# Patient Record
Sex: Female | Born: 1960 | Race: Black or African American | Hispanic: No | Marital: Single | Smoking: Former smoker
Health system: Southern US, Community
[De-identification: ages and names within clinical notes are randomized; demographics above are authoritative.]

## PROBLEM LIST (undated history)

## (undated) DIAGNOSIS — I509 Heart failure, unspecified: Secondary | ICD-10-CM

## (undated) DIAGNOSIS — J45909 Unspecified asthma, uncomplicated: Secondary | ICD-10-CM

## (undated) DIAGNOSIS — I10 Essential (primary) hypertension: Secondary | ICD-10-CM

## (undated) DIAGNOSIS — N939 Abnormal uterine and vaginal bleeding, unspecified: Secondary | ICD-10-CM

## (undated) DIAGNOSIS — G4733 Obstructive sleep apnea (adult) (pediatric): Secondary | ICD-10-CM

## (undated) DIAGNOSIS — J449 Chronic obstructive pulmonary disease, unspecified: Secondary | ICD-10-CM

## (undated) DIAGNOSIS — I272 Pulmonary hypertension, unspecified: Secondary | ICD-10-CM

## (undated) HISTORY — DX: Pulmonary hypertension, unspecified: I27.20

## (undated) HISTORY — PX: APPENDECTOMY: SHX54

## (undated) HISTORY — DX: Obstructive sleep apnea (adult) (pediatric): G47.33

## (undated) HISTORY — DX: Heart failure, unspecified: I50.9

---

## 2004-12-03 ENCOUNTER — Emergency Department: Payer: Self-pay | Admitting: Emergency Medicine

## 2005-02-02 ENCOUNTER — Ambulatory Visit: Payer: Self-pay | Admitting: Family Medicine

## 2005-03-21 ENCOUNTER — Ambulatory Visit: Payer: Self-pay

## 2005-06-15 ENCOUNTER — Ambulatory Visit: Payer: Self-pay

## 2006-07-12 ENCOUNTER — Ambulatory Visit: Payer: Self-pay

## 2007-09-18 ENCOUNTER — Emergency Department: Payer: Self-pay | Admitting: Emergency Medicine

## 2008-01-01 ENCOUNTER — Ambulatory Visit: Payer: Self-pay

## 2008-08-28 ENCOUNTER — Emergency Department: Payer: Self-pay | Admitting: Emergency Medicine

## 2008-08-30 ENCOUNTER — Inpatient Hospital Stay: Payer: Self-pay | Admitting: Internal Medicine

## 2008-09-15 ENCOUNTER — Ambulatory Visit: Payer: Self-pay | Admitting: Family Medicine

## 2009-06-23 ENCOUNTER — Ambulatory Visit: Payer: Self-pay | Admitting: Family Medicine

## 2010-09-15 ENCOUNTER — Emergency Department: Payer: Self-pay | Admitting: Emergency Medicine

## 2012-03-05 ENCOUNTER — Emergency Department: Payer: Self-pay | Admitting: Emergency Medicine

## 2012-03-05 LAB — URINALYSIS, COMPLETE
Ketone: NEGATIVE
Nitrite: NEGATIVE
Ph: 6 (ref 4.5–8.0)
Protein: 30
RBC,UR: 17 /HPF (ref 0–5)
Specific Gravity: 1.016 (ref 1.003–1.030)
Transitional Epi: 1
WBC UR: 304 /HPF (ref 0–5)

## 2012-03-08 ENCOUNTER — Emergency Department: Payer: Self-pay | Admitting: Emergency Medicine

## 2012-03-08 LAB — URINALYSIS, COMPLETE: WBC UR: 3808 /HPF (ref 0–5)

## 2012-03-10 LAB — URINE CULTURE

## 2014-04-06 ENCOUNTER — Emergency Department: Payer: Self-pay | Admitting: Emergency Medicine

## 2014-04-06 LAB — CBC
HCT: 43.2 % (ref 35.0–47.0)
HCT: 41 % (ref 35.0–47.0)
HGB: 13.7 g/dL (ref 12.0–16.0)
HGB: 13.3 g/dL (ref 12.0–16.0)
MCH: 29.4 pg (ref 26.0–34.0)
MCH: 30 pg (ref 26.0–34.0)
MCHC: 31.7 g/dL — AB (ref 32.0–36.0)
MCHC: 32.4 g/dL (ref 32.0–36.0)
MCV: 93 fL (ref 80–100)
MCV: 93 fL (ref 80–100)
PLATELETS: 279 10*3/uL (ref 150–440)
Platelet: 279 10*3/uL (ref 150–440)
RBC: 4.66 10*6/uL (ref 3.80–5.20)
RBC: 4.43 10*6/uL (ref 3.80–5.20)
RDW: 17.4 % — ABNORMAL HIGH (ref 11.5–14.5)
RDW: 17.4 % — AB (ref 11.5–14.5)
WBC: 6.3 10*3/uL (ref 3.6–11.0)
WBC: 6.9 10*3/uL (ref 3.6–11.0)

## 2014-04-06 LAB — URINALYSIS, COMPLETE
SPECIFIC GRAVITY: 1.016 (ref 1.003–1.030)
Squamous Epithelial: NONE SEEN

## 2014-10-05 ENCOUNTER — Emergency Department: Payer: Self-pay | Admitting: Emergency Medicine

## 2015-03-23 ENCOUNTER — Emergency Department
Admit: 2015-03-23 | Disposition: A | Discharge status: Home / Self Care | Payer: Self-pay | Admitting: Emergency Medicine

## 2015-03-23 LAB — BASIC METABOLIC PANEL
Anion Gap: 7 (ref 7–16)
BUN: 14 mg/dL
CALCIUM: 8.9 mg/dL
CO2: 29 mmol/L
CREATININE: 0.94 mg/dL
Chloride: 103 mmol/L
EGFR (African American): >60
EGFR (Non-African Amer.): >60
Glucose: 105 mg/dL — ABNORMAL HIGH
Potassium: 4 mmol/L
SODIUM: 139 mmol/L

## 2015-03-23 LAB — CBC
HCT: 40.9 % (ref 35.0–47.0)
HGB: 12.8 g/dL (ref 12.0–16.0)
MCH: 28.3 pg (ref 26.0–34.0)
MCHC: 31.3 g/dL — ABNORMAL LOW (ref 32.0–36.0)
MCV: 91 fL (ref 80–100)
PLATELETS: 280 10*3/uL (ref 150–440)
RBC: 4.51 10*6/uL (ref 3.80–5.20)
RDW: 17.9 % — ABNORMAL HIGH (ref 11.5–14.5)
WBC: 8.7 10*3/uL (ref 3.6–11.0)

## 2015-03-23 LAB — TROPONIN I: Troponin-I: <0.03 ng/mL

## 2016-03-24 ENCOUNTER — Encounter: Payer: Self-pay | Admitting: Emergency Medicine

## 2016-03-24 ENCOUNTER — Ambulatory Visit
Admission: EM | Admit: 2016-03-24 | Discharge: 2016-03-24 | Disposition: A | Discharge status: Home / Self Care | Payer: Self-pay | Attending: Family Medicine | Admitting: Family Medicine

## 2016-03-24 DIAGNOSIS — N924 Excessive bleeding in the premenopausal period: Secondary | ICD-10-CM

## 2016-03-24 DIAGNOSIS — N938 Other specified abnormal uterine and vaginal bleeding: Secondary | ICD-10-CM

## 2016-03-24 HISTORY — DX: Essential (primary) hypertension: I10

## 2016-03-24 LAB — CBC WITH DIFFERENTIAL/PLATELET
BASOS PCT: 1 %
Basophils Absolute: 0.1 10*3/uL (ref 0–0.1)
EOS PCT: 1 %
Eosinophils Absolute: 0.1 10*3/uL (ref 0–0.7)
HEMATOCRIT: 39.7 % (ref 35.0–47.0)
Hemoglobin: 12.9 g/dL (ref 12.0–16.0)
LYMPHS ABS: 3 10*3/uL (ref 1.0–3.6)
Lymphocytes Relative: 28 %
MCH: 28.4 pg (ref 26.0–34.0)
MCHC: 32.5 g/dL (ref 32.0–36.0)
MCV: 87.4 fL (ref 80.0–100.0)
MONO ABS: 0.7 10*3/uL (ref 0.2–0.9)
Monocytes Relative: 6 %
Neutro Abs: 6.8 10*3/uL — ABNORMAL HIGH (ref 1.4–6.5)
Neutrophils Relative %: 64 %
Platelets: 298 10*3/uL (ref 150–440)
RBC: 4.55 MIL/uL (ref 3.80–5.20)
RDW: 18.9 % — ABNORMAL HIGH (ref 11.5–14.5)
WBC: 10.7 10*3/uL (ref 3.6–11.0)

## 2016-03-24 LAB — BASIC METABOLIC PANEL
ANION GAP: 5 (ref 5–15)
BUN: 11 mg/dL (ref 6–20)
CO2: 25 mmol/L (ref 22–32)
Calcium: 8.8 mg/dL — ABNORMAL LOW (ref 8.9–10.3)
Chloride: 105 mmol/L (ref 101–111)
Creatinine, Ser: 0.63 mg/dL (ref 0.44–1.00)
GFR calc Af Amer: >60 mL/min (ref >60)
Glucose, Bld: 98 mg/dL (ref 65–99)
POTASSIUM: 4.1 mmol/L (ref 3.5–5.1)
Sodium: 135 mmol/L (ref 135–145)

## 2016-03-24 MED ORDER — KETOROLAC TROMETHAMINE 60 MG/2ML IM SOLN
60.0000 mg | Freq: Once | INTRAMUSCULAR | Status: AC
  Administered 2016-03-24: 60 mg via INTRAMUSCULAR

## 2016-03-24 MED ORDER — NAPROXEN SODIUM 550 MG PO TABS: 550.0000 mg | ORAL_TABLET | Freq: Two times a day (BID) | ORAL | Status: DC

## 2016-03-24 NOTE — ED Notes (Signed)
Pt reports vaginal bleeding that started worsening last night. Pt reports has ongoing vaginal bleeding over last 3 years was seen at [facility] then and given depo that helped but then bleeding returned and has been intermittent in severity but this morning awoke with worsened bleeding. Pt reports vaginal pain as well denies abd pain.

## 2016-03-24 NOTE — ED Provider Notes (Signed)
CSN: [id number]     Arrival date & time 03/24/16  1007 History   First MD Initiated Contact with Patient 03/24/16 1128    Nurses notes were reviewed.  Chief Complaint  Patient presents with  . Vaginal Bleeding   Patient is here because of vaginal bleeding she states that she's been bleeding off and on since December. Using a 4 day or 2 but today she had bleeding that was extreme and woke her up. She states that she woke in a puddle blood in the bed. Her friend and roommate states that she's had times where she is literally dripping blood at times and if the toilet will have clots of blood present. Interesting she had menopausal symptoms change life which was 50. When she was 68 she started bleeding again and was evaluated by the OB/gyn at [place]. She states that she was treated and no signs of uterine cancer was found. At that time she was given an injection of double Provera which actually stopped the bleeding for about 3 years. She states that in December when she started having bleeding she contacted [facility] about a GYN appointment and this was called back and they never did. The bleeding started back up in December but has never been this bad as it was today. She is also having abdominal pain and cramping since her bleeding started again   She reports feeling lightheaded and dizzy now since the bleeding has started. She denies any chance of pregnancy since her last time she was sexually active was about 4 years ago. She does have history of hypertension she does smoke and she's had 2 C-sections. She reports her mother wanted having a hysterectomy in her 35s as well because of postmenopausal bleeding.   (Consider location/radiation/quality/duration/timing/severity/associated sxs/prior Treatment) Patient is a 55 y.o. female presenting with vaginal bleeding. The history is provided by the patient. No language interpreter was used.  Vaginal Bleeding Quality:  Bright red and clots Severity:   Moderate Timing:  Constant Progression:  Worsening Chronicity:  New Menstrual history:  Irregular Context: at rest   Relieved by:  Nothing Associated symptoms: abdominal pain   Associated symptoms: no back pain   Risk factors: gynecological surgery   Risk factors: no hx of ectopic pregnancy, no hx of endometriosis, no new sexual partner, no ovarian cysts, no ovarian torsion, no terminated pregnancies and does not have unprotected sex     Past Medical History  Diagnosis Date  . Hypertension    No past surgical history on file. No family history on file. Social History  Substance Use Topics  . Smoking status: Current Every Day Smoker -- 1.00 packs/day    Types: Cigarettes  . Smokeless tobacco: None  . Alcohol Use: No   OB History    No data available     Review of Systems  Constitutional: Negative for activity change and appetite change.  Gastrointestinal: Positive for abdominal pain.  Genitourinary: Positive for vaginal bleeding and menstrual problem.  Musculoskeletal: Negative for myalgias, back pain and arthralgias.  All other systems reviewed and are negative.   Allergies  Review of patient's allergies indicates no known allergies.  Home Medications   Prior to Admission medications   Medication Sig Start Date End Date Taking? Authorizing Provider  naproxen sodium (ANAPROX DS) 550 MG tablet Take 1 tablet (550 mg total) by mouth 2 (two) times daily with a meal. 03/24/16   Frederich Cha, MD   Meds Ordered and Administered this Visit  Medications  ketorolac (TORADOL) injection 60 mg (60 mg Intramuscular Given 03/24/16 1219)    BP 180/89 mmHg  Pulse 87  Temp(Src) 97.9 F (36.6 C) (Tympanic)  Resp 18  Ht 5\' 3"  (1.6 m)  Wt 300 lb (136.079 kg)  BMI 53.16 kg/m2  SpO2 96%  LMP  (LMP Unknown) No data found.   Physical Exam  Constitutional: She is oriented to person, place, and time. She appears well-developed and well-nourished.  HENT:  Head: Normocephalic and  atraumatic.  Eyes: Conjunctivae are normal. Pupils are equal, round, and reactive to light.  Neck: Normal range of motion. Neck supple. No tracheal deviation present. No thyromegaly present.  Cardiovascular: Normal rate, regular rhythm and normal heart sounds.   Pulmonary/Chest: Effort normal and breath sounds normal.  Abdominal: Soft. Bowel sounds are normal. She exhibits no distension. There is no tenderness. There is no rebound.  Musculoskeletal: Normal range of motion.  Neurological: She is alert and oriented to person, place, and time. No cranial nerve deficit.  Skin: Skin is warm and dry. No erythema.  Psychiatric: She has a normal mood and affect.  Vitals reviewed.   ED Course  Procedures (including critical care time)  Labs Review Labs Reviewed  CBC WITH DIFFERENTIAL/PLATELET - Abnormal; Notable for the following:    RDW 18.9 (*)    Neutro Abs 6.8 (*)    All other components within normal limits  BASIC METABOLIC PANEL - Abnormal; Notable for the following:    Calcium 8.8 (*)    All other components within normal limits    Imaging Review No results found.   Visual Acuity Review  Right Eye Distance:   Left Eye Distance:   Bilateral Distance:    Right Eye Near:   Left Eye Near:    Bilateral Near:      Results for orders placed or performed during the hospital encounter of 03/24/16  CBC with Differential  Result Value Ref Range   WBC 10.7 3.6 - 11.0 K/uL   RBC 4.55 3.80 - 5.20 MIL/uL   Hemoglobin 12.9 12.0 - 16.0 g/dL   HCT 39.7 35.0 - 47.0 %   MCV 87.4 80.0 - 100.0 fL   MCH 28.4 26.0 - 34.0 pg   MCHC 32.5 32.0 - 36.0 g/dL   RDW 18.9 (H) 11.5 - 14.5 %   Platelets 298 150 - 440 K/uL   Neutrophils Relative % 64 %   Neutro Abs 6.8 (H) 1.4 - 6.5 K/uL   Lymphocytes Relative 28 %   Lymphs Abs 3.0 1.0 - 3.6 K/uL   Monocytes Relative 6 %   Monocytes Absolute 0.7 0.2 - 0.9 K/uL   Eosinophils Relative 1 %   Eosinophils Absolute 0.1 0 - 0.7 K/uL   Basophils  Relative 1 %   Basophils Absolute 0.1 0 - 0.1 K/uL  Basic metabolic panel  Result Value Ref Range   Sodium 135 135 - 145 mmol/L   Potassium 4.1 3.5 - 5.1 mmol/L   Chloride 105 101 - 111 mmol/L   CO2 25 22 - 32 mmol/L   Glucose, Bld 98 65 - 99 mg/dL   BUN 11 6 - 20 mg/dL   Creatinine, Ser 0.63 0.44 - 1.00 mg/dL   Calcium 8.8 (L) 8.9 - 10.3 mg/dL   GFR calc non Af Amer >60 >60 mL/min   GFR calc Af Amer >60 >60 mL/min   Anion gap 5 5 - 15     MDM   1. Dysfunctional uterine bleeding  2. Menopausal menorrhagia      Dysfunctional uterine bleeding. Will give prescription for Anaprox DS for cramping pain and dysmenorrhea. Patient call [facility] to make arrangements to be seen at the OB/GYN clinic there. Will give her note for today and tomorrow for her work. Patient was informed that her hemoglobin and hematocrit was 12.9 and 39.7 Note: This dictation was prepared with Dragon dictation along with smaller phrase technology. Any transcriptional errors that result from this process are unintentional.  Frederich Cha, MD 03/24/16 2243

## 2016-03-24 NOTE — Discharge Instructions (Signed)
Dysfunctional Uterine Bleeding Dysfunctional uterine bleeding is abnormal bleeding from the uterus. Dysfunctional uterine bleeding includes:  A period that comes earlier or later than usual.  A period that is lighter, heavier, or has blood clots.  Bleeding between periods.  Skipping one or more periods.  Bleeding after sexual intercourse.  Bleeding after menopause. HOME CARE INSTRUCTIONS  Pay attention to any changes in your symptoms. Follow these instructions to help with your condition: Eating  Eat well-balanced meals. Include foods that are high in iron, such as liver, meat, shellfish, green leafy vegetables, and eggs.  If you become constipated:  Drink plenty of water.  Eat fruits and vegetables that are high in water and fiber, such as spinach, carrots, raspberries, apples, and mango. Medicines  Take over-the-counter and prescription medicines only as told by your health care provider.  Do not change medicines without talking with your health care provider.  Aspirin or medicines that contain aspirin may make the bleeding worse. Do not take those medicines:  During the week before your period.  During your period.  If you were prescribed iron pills, take them as told by your health care provider. Iron pills help to replace iron that your body loses because of this condition. Activity  If you need to change your sanitary pad or tampon more than one time every 2 hours:  Lie in bed with your feet raised (elevated).  Place a cold pack on your lower abdomen.  Rest as much as possible until the bleeding stops or slows down.  Do not try to lose weight until the bleeding has stopped and your blood iron level is back to normal. Other Instructions  For two months, write down:  When your period starts.  When your period ends.  When any abnormal bleeding occurs.  What problems you notice.  Keep all follow up visits as told by your health care provider. This is  important. SEEK MEDICAL CARE IF:  You get light-headed or weak.  You have nausea and vomiting.  You cannot eat or drink without vomiting.  You feel dizzy or have diarrhea while you are taking medicines.  You are taking birth control pills or hormones, and you want to change them or stop taking them. SEEK IMMEDIATE MEDICAL CARE IF:  You develop a fever or chills.  You need to change your sanitary pad or tampon more than one time per hour.  Your bleeding becomes heavier, or your flow contains clots more often.  You develop pain in your abdomen.  You lose consciousness.  You develop a rash.   This information is not intended to replace advice given to you by your health care provider. Make sure you discuss any questions you have with your health care provider.   Document Released: 12/02/2000 Document Revised: 08/26/2015 Document Reviewed: 03/02/2015 Elsevier Interactive Patient Education 2016 Elsevier Inc.  Abnormal Uterine Bleeding Abnormal uterine bleeding means bleeding from the vagina that is not your normal menstrual period. This can be:  Bleeding or spotting between periods.  Bleeding after sex (sexual intercourse).  Bleeding that is heavier or more than normal.  Periods that last longer than usual.  Bleeding after menopause. There are many problems that may cause this. Treatment will depend on the cause of the bleeding. Any kind of bleeding that is not normal should be reviewed by your doctor.  HOME CARE Watch your condition for any changes. These actions may lessen any discomfort you are having:  Do not use tampons or douches  as told by your doctor.  Change your pads often. You should get regular pelvic exams and Pap tests. Keep all appointments for tests as told by your doctor. GET HELP IF:  You are bleeding for more than 1 week.  You feel dizzy at times. GET HELP RIGHT AWAY IF:   You pass out.  You have to change pads every 15 to 30 minutes.  You  have belly pain.  You have a fever.  You become sweaty or weak.  You are passing large blood clots from the vagina.  You feel sick to your stomach (nauseous) and throw up (vomit). MAKE SURE YOU:  Understand these instructions.  Will watch your condition.  Will get help right away if you are not doing well or get worse.   This information is not intended to replace advice given to you by your health care provider. Make sure you discuss any questions you have with your health care provider.   Document Released: 10/02/2009 Document Revised: 12/10/2013 Document Reviewed: 07/04/2013 Elsevier Interactive Patient Education 2016 Elsevier Inc.  Menorrhagia Menorrhagia is when your menstrual periods are heavy or last longer than usual.  HOME CARE  Only take medicine as told by your doctor.  Take any iron pills as told by your doctor. Heavy bleeding may cause low levels of iron in your body.  Do not take aspirin 1 week before or during your period. Aspirin can make the bleeding worse.  Lie down for a while if you change your tampon or pad more than once in 2 hours. This may help lessen the bleeding.  Eat a healthy diet and foods with iron. These foods include leafy green vegetables, meat, liver, eggs, and whole grain breads and cereals.  Do not try to lose weight. Wait until the heavy bleeding has stopped and your iron level is normal. GET HELP IF:  You soak through a pad or tampon every 1 or 2 hours, and this happens every time you have a period.  You need to use pads and tampons at the same time because you are bleeding so much.  You need to change your pad or tampon during the night.  You have a period that lasts for more than 8 days.  You pass clots bigger than 1 inch (2.5 cm) wide.  You have irregular periods that happen more or less often than once a month.  You feel dizzy or pass out (faint).  You feel very weak or tired.  You feel short of breath or feel your  heart is beating too fast when you exercise.  You feel sick to your stomach (nausea) and you throw up (vomit) while you are taking your medicine.   You have watery poop (diarrhea) while you are taking your medicine.  You have any problems that may be related to the medicine you are taking.  GET HELP RIGHT AWAY IF:  You soak through 4 or more pads or tampons in 2 hours.  You have any bleeding while you are pregnant. MAKE SURE YOU:   Understand these instructions.  Will watch your condition.  Will get help right away if you are not doing well or get worse.   This information is not intended to replace advice given to you by your health care provider. Make sure you discuss any questions you have with your health care provider.   Document Released: 09/13/2008 Document Revised: 08/07/2013 Document Reviewed: 06/06/2013 Elsevier Interactive Patient Education [employer].

## 2016-04-26 ENCOUNTER — Ambulatory Visit: Payer: Self-pay | Admitting: Obstetrics and Gynecology

## 2016-05-24 ENCOUNTER — Ambulatory Visit: Payer: Self-pay | Admitting: Obstetrics and Gynecology

## 2016-06-11 ENCOUNTER — Emergency Department
Admission: EM | Admit: 2016-06-11 | Discharge: 2016-06-11 | Disposition: A | Discharge status: Home / Self Care | Payer: Self-pay | Attending: Emergency Medicine | Admitting: Emergency Medicine

## 2016-06-11 ENCOUNTER — Encounter: Payer: Self-pay | Admitting: Emergency Medicine

## 2016-06-11 DIAGNOSIS — Y999 Unspecified external cause status: Secondary | ICD-10-CM | POA: Insufficient documentation

## 2016-06-11 DIAGNOSIS — L03211 Cellulitis of face: Secondary | ICD-10-CM | POA: Insufficient documentation

## 2016-06-11 DIAGNOSIS — Z87891 Personal history of nicotine dependence: Secondary | ICD-10-CM | POA: Insufficient documentation

## 2016-06-11 DIAGNOSIS — Y939 Activity, unspecified: Secondary | ICD-10-CM | POA: Insufficient documentation

## 2016-06-11 DIAGNOSIS — I10 Essential (primary) hypertension: Secondary | ICD-10-CM | POA: Insufficient documentation

## 2016-06-11 DIAGNOSIS — Y929 Unspecified place or not applicable: Secondary | ICD-10-CM | POA: Insufficient documentation

## 2016-06-11 DIAGNOSIS — W57XXXA Bitten or stung by nonvenomous insect and other nonvenomous arthropods, initial encounter: Secondary | ICD-10-CM | POA: Insufficient documentation

## 2016-06-11 MED ORDER — SULFAMETHOXAZOLE-TRIMETHOPRIM 800-160 MG PO TABS: 1.0000 | ORAL_TABLET | Freq: Two times a day (BID) | ORAL | Status: DC

## 2016-06-11 NOTE — ED Provider Notes (Signed)
[facility] Emergency Department Provider Note  ____________________________________________  Time seen: Approximately 6:38 PM  I have reviewed the triage vital signs and the nursing notes.   HISTORY  Chief Complaint Insect Bite   HPI Christine Oneill is a 55 y.o. female who presents to the emergency department for evaluation of a potential abscess/insect bite on her face. She noticed it about 1 week ago and pulled a hair out of the center of it but it has gotten larger and more tender. She denies fever.  Past Medical History  Diagnosis Date  . Hypertension     There are no active problems to display for this patient.   History reviewed. No pertinent past surgical history.  Current Outpatient Rx  Name  Route  Sig  Dispense  Refill  . naproxen sodium (ANAPROX DS) 550 MG tablet   Oral   Take 1 tablet (550 mg total) by mouth 2 (two) times daily with a meal.   60 tablet   0   . sulfamethoxazole-trimethoprim (BACTRIM DS,SEPTRA DS) 800-160 MG tablet   Oral   Take 1 tablet by mouth 2 (two) times daily.   20 tablet   0     Allergies Review of patient's allergies indicates no known allergies.  No family history on file.  Social History Social History  Substance Use Topics  . Smoking status: Former Smoker -- 1.00 packs/day    Types: Cigarettes  . Smokeless tobacco: None  . Alcohol Use: No    Review of Systems  Constitutional: Negative for fever/chills Respiratory: Negative for shortness of breath. Musculoskeletal: Negative for pain. Skin: Positive for tender lesion. Neurological: Negative for headaches, focal weakness or numbness. ____________________________________________   PHYSICAL EXAM:  VITAL SIGNS: ED Triage Vitals  Enc Vitals Group     BP 06/11/16 1652 146/89 mmHg     Pulse Rate 06/11/16 1652 80     Resp 06/11/16 1652 18     Temp 06/11/16 1652 98.6 F (37 C)     Temp Source 06/11/16 1652 Oral     SpO2 06/11/16 1652 94 %     Weight 06/11/16 1652 299 lb (135.626 kg)     Height 06/11/16 1652 5\' 3"  (1.6 m)     Head Cir --      Peak Flow --      Pain Score 06/11/16 1653 6     Pain Loc --      Pain Edu? --      Excl. in [place]? --      Constitutional: Alert and oriented. Well appearing and in no acute distress. Eyes: Conjunctivae are normal. EOMI. Nose: No congestion/rhinnorhea. Mouth/Throat: Mucous membranes are moist.   Neck: No stridor. Lymphatic: No cervical lymphadenopathy. Cardiovascular: Good peripheral circulation. Respiratory: Normal respiratory effort.  No retractions. Musculoskeletal: FROM throughout. Neurologic:  Normal speech and language. No gross focal neurologic deficits are appreciated. Skin:  Indurated, 1cm raised lesion without fluctuance present on the left lower jaw area that has a central punctate mark.  ____________________________________________   LABS (all labs ordered are listed, but only abnormal results are displayed)  Labs Reviewed - No data to display ____________________________________________  EKG   ____________________________________________  RADIOLOGY  Not indicated ____________________________________________   PROCEDURES  Procedure(s) performed: None ____________________________________________   INITIAL IMPRESSION / ASSESSMENT AND PLAN / ED COURSE  Pertinent labs & imaging results that were available during my care of the patient were reviewed by me and considered in my medical decision making (see chart  for details).  She will be advised to take Bactrim DS.  She was advised to follow up with PCP if not improving over the next 2 days.  She was also advised to return to the emergency department for symptoms that change or worsen if unable to schedule an appointment.  ____________________________________________   FINAL CLINICAL IMPRESSION(S) / ED DIAGNOSES  Final diagnoses:  Cellulitis of face    Discharge Medication List as of 06/11/2016   6:00 PM    START taking these medications   Details  sulfamethoxazole-trimethoprim (BACTRIM DS,SEPTRA DS) 800-160 MG tablet Take 1 tablet by mouth 2 (two) times daily., Starting 06/11/2016, Until Discontinued, [place], FNP 06/11/16 1848  Orbie Pyo, MD 06/12/16 [phone]

## 2016-06-11 NOTE — Discharge Instructions (Signed)

## 2016-06-11 NOTE — ED Notes (Signed)
Bump on face assumed to be insect bite x 1 week.

## 2017-02-25 ENCOUNTER — Encounter: Payer: Self-pay | Admitting: Emergency Medicine

## 2017-02-25 ENCOUNTER — Emergency Department
Admission: EM | Admit: 2017-02-25 | Discharge: 2017-02-25 | Disposition: A | Discharge status: Home / Self Care | Payer: Self-pay | Attending: Emergency Medicine | Admitting: Emergency Medicine

## 2017-02-25 DIAGNOSIS — B9789 Other viral agents as the cause of diseases classified elsewhere: Secondary | ICD-10-CM

## 2017-02-25 DIAGNOSIS — Z87891 Personal history of nicotine dependence: Secondary | ICD-10-CM | POA: Insufficient documentation

## 2017-02-25 DIAGNOSIS — J069 Acute upper respiratory infection, unspecified: Secondary | ICD-10-CM | POA: Insufficient documentation

## 2017-02-25 DIAGNOSIS — Z79899 Other long term (current) drug therapy: Secondary | ICD-10-CM | POA: Insufficient documentation

## 2017-02-25 DIAGNOSIS — I10 Essential (primary) hypertension: Secondary | ICD-10-CM | POA: Insufficient documentation

## 2017-02-25 MED ORDER — HYDROCOD POLST-CPM POLST ER 10-8 MG/5ML PO SUER
5.0000 mL | Freq: Once | ORAL | Status: AC
  Administered 2017-02-25: 5 mL via ORAL
  Filled 2017-02-25: qty 5

## 2017-02-25 MED ORDER — GUAIFENESIN-CODEINE 100-10 MG/5ML PO SYRP: 5.0000 mL | ORAL_SOLUTION | Freq: Three times a day (TID) | ORAL | 0 refills | Status: DC | PRN

## 2017-02-25 NOTE — ED Triage Notes (Signed)
Pt to ed with c/o cough since last night,  Chills and bodyaches.

## 2017-02-25 NOTE — ED Notes (Signed)
Pt. Going home with family. 

## 2017-02-25 NOTE — ED Notes (Signed)
Pt. Presents to flex with cough, chills and muscle aches that started last night.  Pt. Denies anyone at home with same symptoms.  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

## 2017-02-25 NOTE — ED Provider Notes (Signed)
[facility] Emergency Department Provider Note  ____________________________________________  Time seen: Approximately 7:56 PM  I have reviewed the triage vital signs and the nursing notes.   HISTORY  Chief Complaint Cough   HPI Christine Oneill is a 56 y.o. female who presents to the emergency department for evaluation ofcough, chills, and body aches that started last night. She has not taken any over-the-counter medications with the exception of some Robitussin which did not provide any relief. 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 Patient denies fever. She denies nausea, vomiting, or diarrhea.   Past Medical History:  Diagnosis Date  . Hypertension     There are no active problems to display for this patient.   History reviewed. No pertinent surgical history.  Prior to Admission medications   Medication Sig Start Date End Date Taking? Authorizing Provider  guaiFENesin-codeine (ROBITUSSIN AC) 100-10 MG/5ML syrup Take 5 mLs by mouth 3 (three) times daily as needed for cough. 02/25/17   Victorino Dike, FNP  naproxen sodium (ANAPROX DS) 550 MG tablet Take 1 tablet (550 mg total) by mouth 2 (two) times daily with a meal. 03/24/16   Frederich Cha, MD  sulfamethoxazole-trimethoprim (BACTRIM DS,SEPTRA DS) 800-160 MG tablet Take 1 tablet by mouth 2 (two) times daily. 06/11/16   Victorino Dike, FNP    Allergies Patient has no known allergies.  History reviewed. No pertinent family history.  Social History Social History  Substance Use Topics  . Smoking status: Former Smoker    Packs/day: 1.00    Types: Cigarettes  . Smokeless tobacco: Never Used  . Alcohol use No    Review of Systems Constitutional: Negative for fever/chills ENT: Negative for sore throat. Cardiovascular: Denies chest pain. Respiratory: Negative for shortness of breath. Positive for cough. Gastrointestinal: Negative for nausea,   negative vomiting.  Negative for diarrhea.  Musculoskeletal: Positive for body aches Skin: Negative for rash. Neurological: Negative for headaches ____________________________________________   PHYSICAL EXAM:  VITAL SIGNS: ED Triage Vitals  Enc Vitals Group     BP 02/25/17 1939 126/73     Pulse Rate 02/25/17 1939 86     Resp 02/25/17 1939 18     Temp 02/25/17 1939 99.3 F (37.4 C)     Temp Source 02/25/17 1939 Oral     SpO2 02/25/17 1939 94 %     Weight 02/25/17 1939 (!) 315 lb (142.9 kg)     Height 02/25/17 1939 5\' 3"  (1.6 m)     Head Circumference --      Peak Flow --      Pain Score 02/25/17 1859 6     Pain Loc --      Pain Edu? --      Excl. in [place]? --     Constitutional: Alert and oriented. Acutely ill appearing and in no acute distress. Eyes: Conjunctivae are normal. EOMI. Ears: Bilateral tympanic membranes appear normal Nose: No sinus congestion noted; no rhinnorhea. Mouth/Throat: Mucous membranes are moist.  Oropharynx normal. Tonsils not visualized. Neck: No stridor.  Lymphatic: No cervical lymphadenopathy. Cardiovascular: Normal rate, regular rhythm. Good peripheral circulation. Respiratory: Normal respiratory effort.  No retractions. Breath sounds clear to auscultation throughout.. Gastrointestinal: Soft and nontender.  Musculoskeletal: FROM x 4 extremities.  Neurologic:  Normal speech and language.  Skin:  Skin is warm, dry and intact. No rash noted. Psychiatric: Mood and affect are normal. Speech and behavior are normal.  ____________________________________________   LABS (all labs ordered are listed, but only abnormal results are displayed)  Labs Reviewed - No data to display ____________________________________________  EKG   ____________________________________________  RADIOLOGY  Not indicated ____________________________________________   PROCEDURES  Procedure(s) performed: No  Critical Care performed:  No ____________________________________________   INITIAL IMPRESSION / ASSESSMENT AND PLAN / ED COURSE  55 year old female presenting to the emergency department for less than 24 hour history of cough, fever, muscle aches that started last night. She will be treated with Robitussin-AC and discharged home. She was advised to follow up with her primary care provider for symptoms that are not improving over the week. She was instructed to return to the emergency department for symptoms change or worsen if she is unable schedule an appointment.  Pertinent labs & imaging results that were available during my care of the patient were reviewed by me and considered in my medical decision making (see chart for details).  Discharge Medication List as of 02/25/2017  8:35 PM    START taking these medications   Details  guaiFENesin-codeine (ROBITUSSIN AC) 100-10 MG/5ML syrup Take 5 mLs by mouth 3 (three) times daily as needed for cough., Starting Sat 02/25/2017, Print        If controlled substance prescribed during this visit, 12 month history viewed on the [place] prior to issuing an initial prescription for Schedule II or III opiod. ____________________________________________   FINAL CLINICAL IMPRESSION(S) / ED DIAGNOSES  Final diagnoses:  Viral URI with cough    Note:  This document was prepared using Dragon voice recognition software and may include unintentional dictation errors.     Victorino Dike, FNP 02/26/17 0003    Earleen Newport, MD 02/26/17 [phone]

## 2017-05-29 ENCOUNTER — Encounter: Payer: Self-pay | Admitting: Emergency Medicine

## 2017-05-29 ENCOUNTER — Emergency Department: Payer: Self-pay

## 2017-05-29 ENCOUNTER — Emergency Department
Admission: EM | Admit: 2017-05-29 | Discharge: 2017-05-29 | Disposition: A | Discharge status: Home / Self Care | Payer: Self-pay | Attending: Emergency Medicine | Admitting: Emergency Medicine

## 2017-05-29 DIAGNOSIS — R0789 Other chest pain: Secondary | ICD-10-CM | POA: Insufficient documentation

## 2017-05-29 DIAGNOSIS — R600 Localized edema: Secondary | ICD-10-CM | POA: Insufficient documentation

## 2017-05-29 DIAGNOSIS — I10 Essential (primary) hypertension: Secondary | ICD-10-CM | POA: Insufficient documentation

## 2017-05-29 DIAGNOSIS — Z87891 Personal history of nicotine dependence: Secondary | ICD-10-CM | POA: Insufficient documentation

## 2017-05-29 LAB — CBC
HCT: 46.2 % (ref 35.0–47.0)
Hemoglobin: 14.9 g/dL (ref 12.0–16.0)
MCH: 29.2 pg (ref 26.0–34.0)
MCHC: 32.2 g/dL (ref 32.0–36.0)
MCV: 90.6 fL (ref 80.0–100.0)
PLATELETS: 273 10*3/uL (ref 150–440)
RBC: 5.1 MIL/uL (ref 3.80–5.20)
RDW: 19.4 % — ABNORMAL HIGH (ref 11.5–14.5)
WBC: 8.3 10*3/uL (ref 3.6–11.0)

## 2017-05-29 LAB — BASIC METABOLIC PANEL
Anion gap: 9 (ref 5–15)
BUN: 23 mg/dL — AB (ref 6–20)
CO2: 29 mmol/L (ref 22–32)
CREATININE: 0.93 mg/dL (ref 0.44–1.00)
Calcium: 9.3 mg/dL (ref 8.9–10.3)
Chloride: 98 mmol/L — ABNORMAL LOW (ref 101–111)
GFR calc Af Amer: >60 mL/min (ref >60)
GFR calc non Af Amer: >60 mL/min (ref >60)
Glucose, Bld: 138 mg/dL — ABNORMAL HIGH (ref 65–99)
Potassium: 3.8 mmol/L (ref 3.5–5.1)
SODIUM: 136 mmol/L (ref 135–145)

## 2017-05-29 LAB — HEPATIC FUNCTION PANEL
ALBUMIN: 3.8 g/dL (ref 3.5–5.0)
ALK PHOS: 67 U/L (ref 38–126)
ALT: 16 U/L (ref 14–54)
AST: 19 U/L (ref 15–41)
BILIRUBIN TOTAL: 0.5 mg/dL (ref 0.3–1.2)
Total Protein: 7.9 g/dL (ref 6.5–8.1)

## 2017-05-29 LAB — TROPONIN I: Troponin I: <0.03 ng/mL (ref <0.03)

## 2017-05-29 LAB — BRAIN NATRIURETIC PEPTIDE: B Natriuretic Peptide: 16 pg/mL (ref 0.0–100.0)

## 2017-05-29 MED ORDER — IBUPROFEN 600 MG PO TABS
600.0000 mg | ORAL_TABLET | Freq: Once | ORAL | Status: AC
  Administered 2017-05-29: 600 mg via ORAL
  Filled 2017-05-29: qty 1

## 2017-05-29 MED ORDER — IBUPROFEN 600 MG PO TABS: 600.0000 mg | ORAL_TABLET | Freq: Three times a day (TID) | ORAL | 0 refills | Status: DC | PRN

## 2017-05-29 MED ORDER — ACETAMINOPHEN 500 MG PO TABS
1000.0000 mg | ORAL_TABLET | Freq: Once | ORAL | Status: AC
  Administered 2017-05-29: 1000 mg via ORAL
  Filled 2017-05-29: qty 2

## 2017-05-29 NOTE — ED Notes (Signed)
Pts husband has taken pt to car via wheelchair. Pt in NAD at this time.

## 2017-05-29 NOTE — Discharge Instructions (Signed)
Please make an appointment to see the cardiologist within a week or so for reevaluation. Return to the emergency department sooner for any new or worsening symptoms such as worsening chest pain, shortness of breath, or for any other concerns.  It was a pleasure to take care of you today, and thank you for coming to our emergency department.  If you have any questions or concerns before leaving please ask the nurse to grab me and I'm more than happy to go through your aftercare instructions again.  If you were prescribed any opioid pain medication today such as Norco, Vicodin, Percocet, morphine, hydrocodone, or oxycodone please make sure you do not drive when you are taking this medication as it can alter your ability to drive safely.  If you have any concerns once you are home that you are not improving or are in fact getting worse before you can make it to your follow-up appointment, please do not hesitate to call 911 and come back for further evaluation.  Darel Hong MD  Results for orders placed or performed during the hospital encounter of 88/89/16  Basic metabolic panel  Result Value Ref Range   Sodium 136 135 - 145 mmol/L   Potassium 3.8 3.5 - 5.1 mmol/L   Chloride 98 (L) 101 - 111 mmol/L   CO2 29 22 - 32 mmol/L   Glucose, Bld 138 (H) 65 - 99 mg/dL   BUN 23 (H) 6 - 20 mg/dL   Creatinine, Ser 0.93 0.44 - 1.00 mg/dL   Calcium 9.3 8.9 - 10.3 mg/dL   GFR calc non Af Amer >60 >60 mL/min   GFR calc Af Amer >60 >60 mL/min   Anion gap 9 5 - 15  CBC  Result Value Ref Range   WBC 8.3 3.6 - 11.0 K/uL   RBC 5.10 3.80 - 5.20 MIL/uL   Hemoglobin 14.9 12.0 - 16.0 g/dL   HCT 46.2 35.0 - 47.0 %   MCV 90.6 80.0 - 100.0 fL   MCH 29.2 26.0 - 34.0 pg   MCHC 32.2 32.0 - 36.0 g/dL   RDW 19.4 (H) 11.5 - 14.5 %   Platelets 273 150 - 440 K/uL  Troponin I  Result Value Ref Range   Troponin I <0.03 <0.03 ng/mL  Brain natriuretic peptide  Result Value Ref Range   B Natriuretic Peptide 16.0 0.0 -  100.0 pg/mL  Hepatic function panel  Result Value Ref Range   Total Protein 7.9 6.5 - 8.1 g/dL   Albumin 3.8 3.5 - 5.0 g/dL   AST 19 15 - 41 U/L   ALT 16 14 - 54 U/L   Alkaline Phosphatase 67 38 - 126 U/L   Total Bilirubin 0.5 0.3 - 1.2 mg/dL   Bilirubin, Direct <0.1 (L) 0.1 - 0.5 mg/dL   Indirect Bilirubin NOT CALCULATED 0.3 - 0.9 mg/dL   Dg Chest 2 View  Result Date: 05/29/2017 CLINICAL DATA:  Chest pain and hypertension EXAM: CHEST  2 VIEW COMPARISON:  March 23, 2015 FINDINGS: There is scarring in the left mid lung. There is mild cardiomegaly with pulmonary venous hypertension. There is no frank edema or consolidation. No adenopathy. No bone lesions. No pneumothorax. IMPRESSION: Pulmonary vascular congestion without frank edema or consolidation. Scarring left mid lung. No adenopathy. Electronically Signed   By: Lowella Grip III M.D.   On: 05/29/2017 11:06

## 2017-05-29 NOTE — ED Triage Notes (Signed)
Began chest pain this am. States started "in my toes and went all the way up" Speaking full sentences.

## 2017-05-29 NOTE — ED Provider Notes (Signed)
[facility] Emergency Department Provider Note  ____________________________________________   First MD Initiated Contact with Patient 05/29/17 1308     (approximate)  I have reviewed the triage vital signs and the nursing notes.   HISTORY  Chief Complaint Chest Pain    HPI Christine Oneill is a 56 y.o. female who comes to the emergency department with sharp aching diffuse upper chest pain that began yesterday. Pain is nonexertional. Not associated with shortness of breath. She is noted roughly 1-2 weeks of bilateral lower extremity edema that she has never had before. She sleeps on 2 pillows which is not changed as she is waking up somewhat short of breath at night. She denies fevers or chills. She denies cough. She denies cardiac history. She denies history of PE or DVT.Symptoms are moderate severity sharp intermittent.   Past Medical History:  Diagnosis Date  . Hypertension     There are no active problems to display for this patient.   History reviewed. No pertinent surgical history.  Prior to Admission medications   Medication Sig Start Date End Date Taking? Authorizing Provider  guaiFENesin-codeine (ROBITUSSIN AC) 100-10 MG/5ML syrup Take 5 mLs by mouth 3 (three) times daily as needed for cough. 02/25/17   Triplett, Cari B, FNP  ibuprofen (ADVIL,MOTRIN) 600 MG tablet Take 1 tablet (600 mg total) by mouth every 8 (eight) hours as needed. 05/29/17   Darel Hong, MD  naproxen sodium (ANAPROX DS) 550 MG tablet Take 1 tablet (550 mg total) by mouth 2 (two) times daily with a meal. 03/24/16   Frederich Cha, MD  sulfamethoxazole-trimethoprim (BACTRIM DS,SEPTRA DS) 800-160 MG tablet Take 1 tablet by mouth 2 (two) times daily. 06/11/16   Victorino Dike, FNP    Allergies Patient has no known allergies.  No family history on file.  Social History Social History  Substance Use Topics  . Smoking status: Former Smoker    Packs/day: 1.00    Types:  Cigarettes  . Smokeless tobacco: Never Used  . Alcohol use No    Review of Systems Constitutional: No fever/chills Eyes: No visual changes. ENT: No sore throat. Cardiovascular: Positive chest pain. Respiratory: Denies shortness of breath. Gastrointestinal: No abdominal pain.  No nausea, no vomiting.  No diarrhea.  No constipation. Genitourinary: Negative for dysuria. Musculoskeletal: Negative for back pain. Skin: Negative for rash. Neurological: Negative for headaches, focal weakness or numbness.   ____________________________________________   PHYSICAL EXAM:  VITAL SIGNS: ED Triage Vitals  Enc Vitals Group     BP 05/29/17 1031 124/64     Pulse Rate 05/29/17 1030 84     Resp 05/29/17 1030 20     Temp 05/29/17 1030 97.9 F (36.6 C)     Temp Source 05/29/17 1030 Oral     SpO2 05/29/17 1030 94 %     Weight 05/29/17 1030 (!) 325 lb (147.4 kg)     Height 05/29/17 1030 5\' 3"  (1.6 m)     Head Circumference --      Peak Flow --      Pain Score 05/29/17 1033 9     Pain Loc --      Pain Edu? --      Excl. in [place]? --     Constitutional: Alert and oriented x 4 well appearing nontoxic no diaphoresis speaks in full, clear sentencesMorbidly obese Eyes: PERRL EOMI. Head: Atraumatic. Nose: No congestion/rhinnorhea. Mouth/Throat: No trismus Neck: No stridor.   Cardiovascular: Normal rate, regular rhythm. Grossly normal heart  sounds.  Good peripheral circulation. Able to lie completely flat no jugular venous distention Respiratory: Normal respiratory effort.  No retractions. Lungs CTAB and moving good air Gastrointestinal: Obese soft nontender Musculoskeletal: 2+ bilateral lower extremity pitting edema to upper shin legs are equal in size Neurologic:  Normal speech and language. No gross focal neurologic deficits are appreciated. Skin:  Skin is warm, dry and intact. No rash noted. Psychiatric: Mood and affect are normal. Speech and behavior are  normal.    ____________________________________________   DIFFERENTIAL  CHF, pulmonary embolism, acute coronary syndrome, pneumothorax ____________________________________________   LABS (all labs ordered are listed, but only abnormal results are displayed)  Labs Reviewed  BASIC METABOLIC PANEL - Abnormal; Notable for the following:       Result Value   Chloride 98 (*)    Glucose, Bld 138 (*)    BUN 23 (*)    All other components within normal limits  CBC - Abnormal; Notable for the following:    RDW 19.4 (*)    All other components within normal limits  HEPATIC FUNCTION PANEL - Abnormal; Notable for the following:    Bilirubin, Direct <0.1 (*)    All other components within normal limits  TROPONIN I  BRAIN NATRIURETIC PEPTIDE    No signs of acute ischemia __________________________________________  EKG  ED ECG REPORT I, Darel Hong, the attending physician, personally viewed and interpreted this ECG.  Date: 05/29/2017 Rate: 91 Rhythm: normal sinus rhythm QRS Axis: normal Intervals: normal ST/T Wave abnormalities: normal Conduction Disturbances: none Narrative Interpretation: unremarkable  ____________________________________________  RADIOLOGY  Chest x-ray concerning for mild venous congestion ____________________________________________   PROCEDURES  Procedure(s) performed: no  Procedures  Critical Care performed: no  Observation: no ____________________________________________   INITIAL IMPRESSION / ASSESSMENT AND PLAN / ED COURSE  Pertinent labs & imaging results that were available during my care of the patient were reviewed by me and considered in my medical decision making (see chart for details).  The patient arrives very well-appearing with atypical sounding chest pain. Her EKG is nonischemic and her troponin is negative. She is able to lie completely flat although she does have bilateral lower extremity edema which is concerning  for right sided heart failure. Legs are equal in size and doubt DVT. X-rays concerning for venous congestion so I added on a beta natruretic peptide which is negative. Regardless she still may be developing some component of congestive heart failure so I will refer her to cardiology as an outpatient. She is discharged home in improved condition and instructed to return precautions given.      ____________________________________________   FINAL CLINICAL IMPRESSION(S) / ED DIAGNOSES  Final diagnoses:  Atypical chest pain      NEW MEDICATIONS STARTED DURING THIS VISIT:  New Prescriptions   IBUPROFEN (ADVIL,MOTRIN) 600 MG TABLET    Take 1 tablet (600 mg total) by mouth every 8 (eight) hours as needed.     Note:  This document was prepared using Dragon voice recognition software and may include unintentional dictation errors.     Darel Hong, MD 05/29/17 1445

## 2017-05-29 NOTE — ED Notes (Signed)
Pt calling out reporting left sided calf pain that is reported to be cramping and squeezing in nature. MD made aware. Calf is not swollen or mis colored. Pulse intact. MD made aware.

## 2017-12-25 ENCOUNTER — Inpatient Hospital Stay
Admission: EM | Admit: 2017-12-25 | Discharge: 2017-12-29 | DRG: 291 | Disposition: A | Discharge status: Home Health | Payer: BLUE CROSS/BLUE SHIELD | Attending: Internal Medicine | Admitting: Internal Medicine

## 2017-12-25 ENCOUNTER — Other Ambulatory Visit: Payer: Self-pay

## 2017-12-25 ENCOUNTER — Emergency Department: Payer: BLUE CROSS/BLUE SHIELD

## 2017-12-25 DIAGNOSIS — I2729 Other secondary pulmonary hypertension: Secondary | ICD-10-CM | POA: Diagnosis present

## 2017-12-25 DIAGNOSIS — J9621 Acute and chronic respiratory failure with hypoxia: Secondary | ICD-10-CM | POA: Diagnosis not present

## 2017-12-25 DIAGNOSIS — I110 Hypertensive heart disease with heart failure: Secondary | ICD-10-CM | POA: Diagnosis present

## 2017-12-25 DIAGNOSIS — E662 Morbid (severe) obesity with alveolar hypoventilation: Secondary | ICD-10-CM | POA: Diagnosis not present

## 2017-12-25 DIAGNOSIS — I5031 Acute diastolic (congestive) heart failure: Secondary | ICD-10-CM

## 2017-12-25 DIAGNOSIS — D630 Anemia in neoplastic disease: Secondary | ICD-10-CM | POA: Diagnosis not present

## 2017-12-25 DIAGNOSIS — R0602 Shortness of breath: Secondary | ICD-10-CM | POA: Diagnosis present

## 2017-12-25 DIAGNOSIS — F17200 Nicotine dependence, unspecified, uncomplicated: Secondary | ICD-10-CM

## 2017-12-25 DIAGNOSIS — J9622 Acute and chronic respiratory failure with hypercapnia: Secondary | ICD-10-CM | POA: Diagnosis present

## 2017-12-25 DIAGNOSIS — M5432 Sciatica, left side: Secondary | ICD-10-CM | POA: Diagnosis present

## 2017-12-25 DIAGNOSIS — Z23 Encounter for immunization: Secondary | ICD-10-CM

## 2017-12-25 DIAGNOSIS — I2781 Cor pulmonale (chronic): Secondary | ICD-10-CM | POA: Diagnosis present

## 2017-12-25 DIAGNOSIS — Z6841 Body Mass Index (BMI) 40.0 and over, adult: Secondary | ICD-10-CM | POA: Diagnosis not present

## 2017-12-25 DIAGNOSIS — J441 Chronic obstructive pulmonary disease with (acute) exacerbation: Secondary | ICD-10-CM | POA: Diagnosis present

## 2017-12-25 DIAGNOSIS — N61 Mastitis without abscess: Secondary | ICD-10-CM

## 2017-12-25 DIAGNOSIS — E782 Mixed hyperlipidemia: Secondary | ICD-10-CM | POA: Diagnosis present

## 2017-12-25 DIAGNOSIS — I272 Pulmonary hypertension, unspecified: Secondary | ICD-10-CM | POA: Diagnosis not present

## 2017-12-25 DIAGNOSIS — F17210 Nicotine dependence, cigarettes, uncomplicated: Secondary | ICD-10-CM | POA: Diagnosis present

## 2017-12-25 DIAGNOSIS — I509 Heart failure, unspecified: Secondary | ICD-10-CM

## 2017-12-25 LAB — TSH: TSH: 2.23 u[IU]/mL (ref 0.350–4.500)

## 2017-12-25 LAB — CBC
HCT: 51.8 % — ABNORMAL HIGH (ref 35.0–47.0)
HEMOGLOBIN: 16.1 g/dL — AB (ref 12.0–16.0)
MCH: 29.5 pg (ref 26.0–34.0)
MCHC: 31.1 g/dL — AB (ref 32.0–36.0)
MCV: 94.9 fL (ref 80.0–100.0)
Platelets: 251 10*3/uL (ref 150–440)
RBC: 5.46 MIL/uL — AB (ref 3.80–5.20)
RDW: 20.8 % — ABNORMAL HIGH (ref 11.5–14.5)
WBC: 7.5 10*3/uL (ref 3.6–11.0)

## 2017-12-25 LAB — BASIC METABOLIC PANEL
ANION GAP: 8 (ref 5–15)
BUN: 16 mg/dL (ref 6–20)
CHLORIDE: 97 mmol/L — AB (ref 101–111)
CO2: 34 mmol/L — AB (ref 22–32)
Calcium: 8.4 mg/dL — ABNORMAL LOW (ref 8.9–10.3)
Creatinine, Ser: 1.06 mg/dL — ABNORMAL HIGH (ref 0.44–1.00)
GFR calc Af Amer: >60 mL/min (ref >60)
GFR calc non Af Amer: 58 mL/min — ABNORMAL LOW (ref >60)
GLUCOSE: 80 mg/dL (ref 65–99)
Potassium: 4.3 mmol/L (ref 3.5–5.1)
Sodium: 139 mmol/L (ref 135–145)

## 2017-12-25 LAB — TROPONIN I: Troponin I: <0.03 ng/mL (ref <0.03)

## 2017-12-25 LAB — BRAIN NATRIURETIC PEPTIDE: B Natriuretic Peptide: 266 pg/mL — ABNORMAL HIGH (ref 0.0–100.0)

## 2017-12-25 MED ORDER — ENOXAPARIN SODIUM 40 MG/0.4ML ~~LOC~~ SOLN
40.0000 mg | Freq: Two times a day (BID) | SUBCUTANEOUS | Status: DC
  Administered 2017-12-25 – 2017-12-29 (×8): 40 mg via SUBCUTANEOUS
  Filled 2017-12-25 (×8): qty 0.4

## 2017-12-25 MED ORDER — ASPIRIN EC 81 MG PO TBEC
81.0000 mg | DELAYED_RELEASE_TABLET | Freq: Every day | ORAL | Status: DC
  Administered 2017-12-25 – 2017-12-29 (×5): 81 mg via ORAL
  Filled 2017-12-25 (×4): qty 1

## 2017-12-25 MED ORDER — ACETAMINOPHEN 650 MG RE SUPP: 650.0000 mg | Freq: Four times a day (QID) | RECTAL | Status: DC | PRN

## 2017-12-25 MED ORDER — ONDANSETRON HCL 4 MG PO TABS: 4.0000 mg | ORAL_TABLET | Freq: Four times a day (QID) | ORAL | Status: DC | PRN

## 2017-12-25 MED ORDER — FUROSEMIDE 10 MG/ML IJ SOLN
60.0000 mg | Freq: Once | INTRAMUSCULAR | Status: AC
  Administered 2017-12-25: 60 mg via INTRAVENOUS
  Filled 2017-12-25: qty 8

## 2017-12-25 MED ORDER — FUROSEMIDE 10 MG/ML IJ SOLN
40.0000 mg | Freq: Three times a day (TID) | INTRAMUSCULAR | Status: DC
  Administered 2017-12-26 – 2017-12-29 (×10): 40 mg via INTRAVENOUS
  Filled 2017-12-25 (×11): qty 4

## 2017-12-25 MED ORDER — POLYETHYLENE GLYCOL 3350 17 G PO PACK: 17.0000 g | PACK | Freq: Every day | ORAL | Status: DC | PRN

## 2017-12-25 MED ORDER — CLINDAMYCIN PHOSPHATE 600 MG/50ML IV SOLN
600.0000 mg | Freq: Once | INTRAVENOUS | Status: AC
  Administered 2017-12-25: 600 mg via INTRAVENOUS
  Filled 2017-12-25: qty 50

## 2017-12-25 MED ORDER — ACETAMINOPHEN 325 MG PO TABS
650.0000 mg | ORAL_TABLET | Freq: Four times a day (QID) | ORAL | Status: DC | PRN
  Administered 2017-12-27: 650 mg via ORAL
  Filled 2017-12-25: qty 2

## 2017-12-25 MED ORDER — ASPIRIN EC 81 MG PO TBEC
DELAYED_RELEASE_TABLET | ORAL | Status: AC
  Filled 2017-12-25: qty 1

## 2017-12-25 MED ORDER — ALBUTEROL SULFATE (2.5 MG/3ML) 0.083% IN NEBU
5.0000 mg | INHALATION_SOLUTION | Freq: Once | RESPIRATORY_TRACT | Status: AC
  Administered 2017-12-25: 5 mg via RESPIRATORY_TRACT
  Filled 2017-12-25: qty 6

## 2017-12-25 MED ORDER — ONDANSETRON HCL 4 MG/2ML IJ SOLN
4.0000 mg | Freq: Four times a day (QID) | INTRAMUSCULAR | Status: DC | PRN
Start: 2017-12-25 — End: 2017-12-29
  Administered 2017-12-25: 4 mg via INTRAVENOUS
  Filled 2017-12-25: qty 2

## 2017-12-25 MED ORDER — PNEUMOCOCCAL VAC POLYVALENT 25 MCG/0.5ML IJ INJ
0.5000 mL | INJECTION | INTRAMUSCULAR | Status: DC
  Filled 2017-12-25: qty 0.5

## 2017-12-25 MED ORDER — ALBUTEROL SULFATE (2.5 MG/3ML) 0.083% IN NEBU
2.5000 mg | INHALATION_SOLUTION | Freq: Four times a day (QID) | RESPIRATORY_TRACT | Status: DC | PRN
  Administered 2017-12-26: 2.5 mg via RESPIRATORY_TRACT
  Filled 2017-12-25: qty 3

## 2017-12-25 NOTE — Progress Notes (Signed)
Patient arrived to 2A Room 231. Patient denies pain and all questions answered. Patient oriented to unit and use of call bell/room phone. Skin assessment completed with Andria Meuse RN; MASD noted to skin folds and boil to L breast. A&Ox4, VSS, and NSR on verified tele-box #[id number]. Husband at bedside. Nursing staff will continue to monitor for any changes in patient status. Earleen Reaper, RN

## 2017-12-25 NOTE — H&P (Signed)
[facility] [place] NAME: Christine Oneill    MR#:  [id number]  DATE OF BIRTH:  05/13/1961  DATE OF ADMISSION:  12/25/2017  PRIMARY CARE PHYSICIAN: [facility] [place]   REQUESTING/REFERRING PHYSICIAN: Dr. Burlene Arnt  CHIEF COMPLAINT:   Increasing shortness of breath, I feel I am swollen HISTORY OF PRESENT ILLNESS:  Christine Oneill  is a 57 y.o. female with a known history of hypertension, chronic smoker comes to the emergency room with increasing shortness of breath leg swelling and feeling bloated all over with weight gain. Reports the symptoms going on for a few weeks however felt poorly with shortness of breath comes to the emergency room found to be in new-onset congestive heart failure  And is morbidly obese likely has underlying sleep apnea as well. She received IV Lasix being admitted for further management management  PAST MEDICAL HISTORY:   Past Medical History:  Diagnosis Date  . Hypertension     PAST SURGICAL HISTOIRY:   Past Surgical History:  Procedure Laterality Date  . APPENDECTOMY    . CESAREAN SECTION      SOCIAL HISTORY:   Social History   Tobacco Use  . Smoking status: Former Smoker    Packs/day: 1.00    Types: Cigarettes  . Smokeless tobacco: Never Used  Substance Use Topics  . Alcohol use: No    FAMILY HISTORY:  History reviewed. No pertinent family history.  DRUG ALLERGIES:  No Known Allergies  REVIEW OF SYSTEMS:  Review of Systems  Constitutional: Negative for chills, fever and weight loss.  HENT: Negative for ear discharge, ear pain and nosebleeds.   Eyes: Negative for blurred vision, pain and discharge.  Respiratory: Positive for shortness of breath. Negative for sputum production, wheezing and stridor.   Cardiovascular: Positive for orthopnea, leg swelling and PND. Negative for chest pain and palpitations.  Gastrointestinal: Negative for abdominal pain, diarrhea, nausea and  vomiting.  Genitourinary: Negative for frequency and urgency.  Musculoskeletal: Negative for back pain and joint pain.  Neurological: Positive for weakness. Negative for sensory change, speech change and focal weakness.  Psychiatric/Behavioral: Negative for depression and hallucinations. The patient is not nervous/anxious.      MEDICATIONS AT HOME:   Prior to Admission medications   Medication Sig Start Date End Date Taking? Authorizing Provider  hydrochlorothiazide (HYDRODIURIL) 12.5 MG tablet Take 12.5 mg by mouth daily.   Yes [provider]  ibuprofen (ADVIL,MOTRIN) 600 MG tablet Take 1 tablet (600 mg total) by mouth every 8 (eight) hours as needed. Patient not taking: Reported on 12/25/2017 05/29/17   Darel Hong, MD      VITAL SIGNS:  Blood pressure 126/84, pulse 93, temperature 97.9 F (36.6 C), temperature source Oral, resp. rate (!) 30, height 5\' 3"  (1.6 m), weight (!) 141.5 kg (312 lb), SpO2 96 %.  PHYSICAL EXAMINATION:  GENERAL:  57 y.o.-year-old patient lying in the bed with no acute distress.  Morbidly obese, anasarca EYES: Pupils equal, round, reactive to light and accommodation. No scleral icterus. Extraocular muscles intact.  HEENT: Head atraumatic, normocephalic. Oropharynx and nasopharynx clear.  NECK:  Supple, no jugular venous distention. No thyroid enlargement, no tenderness.  LUNGS: Normal breath sounds bilaterally, no wheezing, rales,rhonchi or crepitation. No use of accessory muscles of respiration.  CARDIOVASCULAR: S1, S2 normal. No murmurs, rubs, or gallops.  ABDOMEN: Soft, nontender, nondistended. Bowel sounds present. No organomegaly or mass.  EXTREMITIES: ++ pedal edema, no cyanosis, or clubbing.  Do not see any evidence of acute cellulitis NEUROLOGIC: Cranial nerves II through XII are intact. Muscle strength 5/5 in all extremities. Sensation intact. Gait not checked.  PSYCHIATRIC: The patient is alert and oriented x 3.  SKIN: No obvious rash,  lesion, or ulcer.   LABORATORY PANEL:   CBC Recent Labs  Lab 12/25/17 1807  WBC 7.5  HGB 16.1*  HCT 51.8*  PLT 251   ------------------------------------------------------------------------------------------------------------------  Chemistries  Recent Labs  Lab 12/25/17 1807  NA 139  K 4.3  CL 97*  CO2 34*  GLUCOSE 80  BUN 16  CREATININE 1.06*  CALCIUM 8.4*   ------------------------------------------------------------------------------------------------------------------  Cardiac Enzymes Recent Labs  Lab 12/25/17 1807  TROPONINI <0.03   ------------------------------------------------------------------------------------------------------------------  RADIOLOGY:  Dg Chest 2 View  Result Date: 12/25/2017 CLINICAL DATA:  Leg swelling.  Chest pain and hypoxia EXAM: CHEST  2 VIEW COMPARISON:  05/29/2017 FINDINGS: Cardiomegaly, chronic. There is diffuse interstitial opacity that is progressed. No effusion or air bronchograms. Mild scarring in the left mid lung. No pneumothorax. Vascular pedicle widening. IMPRESSION: CHF pattern. Electronically Signed   By: Monte Fantasia M.D.   On: 12/25/2017 18:48    EKG:  Sinus rhythm, left atrial enlargement  IMPRESSION AND PLAN:   Christine Oneill  is a 57 y.o. female with a known history of hypertension, chronic smoker comes to the emergency room with increasing shortness of breath leg swelling and feeling bloated all over with weight gain. Reports the symptoms going on for a few weeks however felt poorly with shortness of breath comes to the emergency room found to be in new-onset congestive heart failure  1.  Acute hypoxic respiratory failure secondary to CHF new onset, unknown -Came in with increasing shortness of breath, leg swelling and feeling bloated with sats 80% on room air in the emergency room -BNP elevated chest x-ray consistent with bilateral pulmonary vascular congestion -IV Lasix 40 3 times daily -Blood pressure soft  and holding of beta blockers and ACE inhibitor -Aspirin -Cardiology consultation -Echo -Troponin x3, TSH  2.  Morbidly obesity with possible underlying sleep apnea -Patient will benefit from sleep study as outpatient.  Will defer either to cardiology or child stroke clinic PCP to get it done  3.  Hypertension -Blood pressure a bit on the softer side will hold off on BP meds  4.  Elevated H&H -Suspect secondary polycythemia in the setting of smoking -Continue to monitor  5.  Tobacco abuse smoking cessation advised about 4 minutes spent  6.  DVT prophylaxis subcu Lovenox  Was discussed with patient patient's husband in the ER.    All the records are reviewed and case discussed with ED provider. Management plans discussed with the patient, family and they are in agreement.  CODE STATUS: full  TOTAL TIME TAKING CARE OF THIS PATIENT: 50 minutes.    Fritzi Mandes M.D on 12/25/2017 at 7:32 PM  Between 7am to 6pm - Pager - [phone]  After 6pm go to [email or web address] - password [email or web address] [facility]  [facility]  Office  [phone]  CC: Primary care physician; [facility] [place]

## 2017-12-25 NOTE — ED Provider Notes (Signed)
[facility] Emergency Department Provider Note  ____________________________________________   I have reviewed the triage vital signs and the nursing notes. Where available I have reviewed prior notes and, if possible and indicated, outside hospital notes.    HISTORY  Chief Complaint Shortness of Breath    HPI Christine Oneill is a 57 y.o. female resents today complaining of shortness of breath, patient has exertional and orthopneic shortness of breath for the last several days.  Also feels that she is "swelling up".  She denies any fever chills nausea vomiting diarrhea.  She denies chest pain.  She does state incidentally that she has a "boil" on her left breast with some redness around it which she noticed today as well.  No fever.    Past Medical History:  Diagnosis Date  . Hypertension     There are no active problems to display for this patient.   Past Surgical History:  Procedure Laterality Date  . APPENDECTOMY    . CESAREAN SECTION      Prior to Admission medications   Medication Sig Start Date End Date Taking? Authorizing Provider  guaiFENesin-codeine (ROBITUSSIN AC) 100-10 MG/5ML syrup Take 5 mLs by mouth 3 (three) times daily as needed for cough. 02/25/17   Triplett, Cari B, FNP  ibuprofen (ADVIL,MOTRIN) 600 MG tablet Take 1 tablet (600 mg total) by mouth every 8 (eight) hours as needed. 05/29/17   Darel Hong, MD  naproxen sodium (ANAPROX DS) 550 MG tablet Take 1 tablet (550 mg total) by mouth 2 (two) times daily with a meal. 03/24/16   Frederich Cha, MD  sulfamethoxazole-trimethoprim (BACTRIM DS,SEPTRA DS) 800-160 MG tablet Take 1 tablet by mouth 2 (two) times daily. 06/11/16   Victorino Dike, FNP    Allergies Patient has no known allergies.  History reviewed. No pertinent family history.  Social History Social History   Tobacco Use  . Smoking status: Former Smoker    Packs/day: 1.00    Types: Cigarettes  . Smokeless tobacco: Never  Used  Substance Use Topics  . Alcohol use: No  . Drug use: No    Review of Systems Constitutional: No fever/chills Eyes: No visual changes. ENT: No sore throat. No stiff neck no neck pain Cardiovascular: Denies chest pain. Respiratory: + shortness of breath. Gastrointestinal:   no vomiting.  No diarrhea.  No constipation. Genitourinary: Negative for dysuria. Musculoskeletal: = lower extremity swelling Skin: Negative for rash. Neurological: Negative for severe headaches, focal weakness or numbness.   ____________________________________________   PHYSICAL EXAM:  VITAL SIGNS: ED Triage Vitals  Enc Vitals Group     BP 12/25/17 1805 (!) 114/58     Pulse Rate 12/25/17 1804 90     Resp 12/25/17 1804 20     Temp 12/25/17 1804 97.9 F (36.6 C)     Temp Source 12/25/17 1804 Oral     SpO2 12/25/17 1800 (!) 80 %     Weight 12/25/17 1805 (!) 312 lb (141.5 kg)     Height 12/25/17 1805 5\' 3"  (1.6 m)     Head Circumference --      Peak Flow --      Pain Score 12/25/17 1803 8     Pain Loc --      Pain Edu? --      Excl. in [place]? --     Constitutional: Alert and oriented. Well appearing and in no acute distress.  Orbitally obese Eyes: Conjunctivae are normal Head: Atraumatic HEENT: No congestion/rhinnorhea. Mucous membranes  are moist.  Oropharynx non-erythematous Neck:   Nontender with no meningismus, no masses, no stridor Cardiovascular: Normal rate, regular rhythm. Grossly normal heart sounds.  Good peripheral circulation. Respiratory: Normal respiratory effort.  No retractions. Lungs managed in the bases Abdominal: Soft and nontender. No distention. No guarding no rebound Back:  There is no focal tenderness or step off.  there is no midline tenderness there are no lesions noted. there is no CVA tenderness Musculoskeletal: No lower extremity tenderness, no upper extremity tenderness. No joint effusions, no DVT signs strong distal pulses significant bilateral symmetric pitting  edema Neurologic:  Normal speech and language. No gross focal neurologic deficits are appreciated.  Skin:  Skin is warm, dry and intact.  There is an area of mild induration without fluctuance left breast with a surrounding cellulitis, no area of abscess noted. Psychiatric: Mood and affect are normal. Speech and behavior are normal.  ____________________________________________   LABS (all labs ordered are listed, but only abnormal results are displayed)  Labs Reviewed  BASIC METABOLIC PANEL - Abnormal; Notable for the following components:      Result Value   Chloride 97 (*)    CO2 34 (*)    Creatinine, Ser 1.06 (*)    Calcium 8.4 (*)    GFR calc non Af Amer 58 (*)    All other components within normal limits  CBC - Abnormal; Notable for the following components:   RBC 5.46 (*)    Hemoglobin 16.1 (*)    HCT 51.8 (*)    MCHC 31.1 (*)    RDW 20.8 (*)    All other components within normal limits  TROPONIN I  BRAIN NATRIURETIC PEPTIDE  POC URINE PREG, ED    Pertinent labs  results that were available during my care of the patient were reviewed by me and considered in my medical decision making (see chart for details). ____________________________________________  EKG  I personally interpreted any EKGs ordered by me or triage Sinus rhythm rate 94 bpm no acute ST elevation or depression normal axis nonspecific ST changes diffuse T wave inversions noted. ____________________________________________  RADIOLOGY  Pertinent labs & imaging results that were available during my care of the patient were reviewed by me and considered in my medical decision making (see chart for details). If possible, patient and/or family made aware of any abnormal findings.  Dg Chest 2 View  Result Date: 12/25/2017 CLINICAL DATA:  Leg swelling.  Chest pain and hypoxia EXAM: CHEST  2 VIEW COMPARISON:  05/29/2017 FINDINGS: Cardiomegaly, chronic. There is diffuse interstitial opacity that is progressed.  No effusion or air bronchograms. Mild scarring in the left mid lung. No pneumothorax. Vascular pedicle widening. IMPRESSION: CHF pattern. Electronically Signed   By: Monte Fantasia M.D.   On: 12/25/2017 18:48   ____________________________________________    PROCEDURES  Procedure(s) performed: None  Procedures  Critical Care performed: CRITICAL CARE Performed by: Schuyler Amor   Total critical care time: 40 minutes  Critical care time was exclusive of separately billable procedures and treating other patients.  Critical care was necessary to treat or prevent imminent or life-threatening deterioration.  Critical care was time spent personally by me on the following activities: development of treatment plan with patient and/or surrogate as well as nursing, discussions with consultants, evaluation of patient's response to treatment, examination of patient, obtaining history from patient or surrogate, ordering and performing treatments and interventions, ordering and review of laboratory studies, ordering and review of radiographic studies, pulse oximetry and  re-evaluation of patient's condition.   ____________________________________________   INITIAL IMPRESSION / ASSESSMENT AND PLAN / ED COURSE  Pertinent labs & imaging results that were available during my care of the patient were reviewed by me and considered in my medical decision making (see chart for details).  Patient here with evidence of acute decompensated CHF with low oxygen saturations chest x-ray vital signs and lab work all consistent with this.  We will give her Lasix.  Also noted is a cellulitic infection to the left breast which we will start her on antibiotics for.    ____________________________________________   FINAL CLINICAL IMPRESSION(S) / ED DIAGNOSES  Final diagnoses:  None      This chart was dictated using voice recognition software.  Despite best efforts to proofread,  errors can occur which  can change meaning.      Schuyler Amor, MD 12/25/17 [phone]

## 2017-12-25 NOTE — Progress Notes (Signed)
Anticoagulation monitoring(Lovenox):  57 yo female ordered Lovenox 40 mg Q24h  Filed Weights   12/25/17 1805  Weight: (!) 312 lb (141.5 kg)   BMI 55.28   Lab Results  Component Value Date   CREATININE 1.06 (H) 12/25/2017   CREATININE 0.93 05/29/2017   CREATININE 0.63 03/24/2016   Estimated Creatinine Clearance: 84.3 mL/min (A) (by C-G formula based on SCr of 1.06 mg/dL (H)). Hemoglobin & Hematocrit     Component Value Date/Time   HGB 16.1 (H) 12/25/2017 1807   HGB 12.8 03/23/2015 1438   HCT 51.8 (H) 12/25/2017 1807   HCT 40.9 03/23/2015 1438     Per Protocol for Patient with estCrcl > 30 ml/min and BMI > 40, will transition to Lovenox 40 mg Q12h.

## 2017-12-25 NOTE — ED Notes (Signed)
Pt ambulated to the bedside commode to void. Pt returned to her bed without difficulty.

## 2017-12-25 NOTE — ED Triage Notes (Signed)
Pt arrives to ED c/o of SOB since yesterday. Pt states she feels like she is swollen. Denies CHF, COPD, asthma. Pt is 80-86% RA.  Placed on 2 L nasal cannula in triage, came up to 92%. Alert, oriented, speaking in complete sentences. Denies N&V, cough, congestion. States CP.

## 2017-12-26 ENCOUNTER — Inpatient Hospital Stay
Admit: 2017-12-26 | Discharge: 2017-12-26 | Disposition: A | Discharge status: Home / Self Care | Payer: BLUE CROSS/BLUE SHIELD | Attending: Internal Medicine | Admitting: Internal Medicine

## 2017-12-26 DIAGNOSIS — D630 Anemia in neoplastic disease: Secondary | ICD-10-CM

## 2017-12-26 DIAGNOSIS — J9621 Acute and chronic respiratory failure with hypoxia: Secondary | ICD-10-CM

## 2017-12-26 DIAGNOSIS — E662 Morbid (severe) obesity with alveolar hypoventilation: Secondary | ICD-10-CM

## 2017-12-26 DIAGNOSIS — I272 Pulmonary hypertension, unspecified: Secondary | ICD-10-CM

## 2017-12-26 DIAGNOSIS — F17200 Nicotine dependence, unspecified, uncomplicated: Secondary | ICD-10-CM

## 2017-12-26 DIAGNOSIS — J9622 Acute and chronic respiratory failure with hypercapnia: Secondary | ICD-10-CM

## 2017-12-26 LAB — BASIC METABOLIC PANEL
ANION GAP: 8 (ref 5–15)
BUN: 16 mg/dL (ref 6–20)
CHLORIDE: 96 mmol/L — AB (ref 101–111)
CO2: 37 mmol/L — AB (ref 22–32)
CREATININE: 1.1 mg/dL — AB (ref 0.44–1.00)
Calcium: 8.2 mg/dL — ABNORMAL LOW (ref 8.9–10.3)
GFR calc non Af Amer: 56 mL/min — ABNORMAL LOW (ref >60)
Glucose, Bld: 119 mg/dL — ABNORMAL HIGH (ref 65–99)
Potassium: 4.7 mmol/L (ref 3.5–5.1)
Sodium: 141 mmol/L (ref 135–145)

## 2017-12-26 LAB — TROPONIN I: Troponin I: <0.03 ng/mL (ref <0.03)

## 2017-12-26 LAB — ECHOCARDIOGRAM COMPLETE
HEIGHTINCHES: 63 in
Weight: 5561.6 oz

## 2017-12-26 LAB — MRSA PCR SCREENING: MRSA by PCR: NEGATIVE

## 2017-12-26 LAB — GLUCOSE, CAPILLARY: GLUCOSE-CAPILLARY: 116 mg/dL — AB (ref 65–99)

## 2017-12-26 MED ORDER — FUROSEMIDE 10 MG/ML IJ SOLN
80.0000 mg | Freq: Once | INTRAMUSCULAR | Status: DC
  Filled 2017-12-26: qty 8

## 2017-12-26 MED ORDER — FUROSEMIDE 10 MG/ML IJ SOLN: 20.0000 mg | Freq: Two times a day (BID) | INTRAMUSCULAR | Status: DC

## 2017-12-26 NOTE — Consult Note (Signed)
PULMONARY/CCM CONSULT NOTE  Requesting MD/Service: Vachhani/hospitalist service Date of initial consultation: 12/26/17 Reason for consultation: Hypercarbic respiratory failure  PT PROFILE: 57 y.o. female smoker with documented history only of hypertension.  Admitted 01/07 with increasing lower extremity edema and shortness of breath.  Transferred to ICU/SDU 01/08 with progressive lethargy and ABG revealing severe hypercarbia  HPI:  As above.  Patient is lethargic and unable to offer much history.  At her baseline she is profoundly limited by obesity.  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  She has had progressive fluid retention and shortness of breath over the past 1-2 weeks per her husband.  He also reports that she is a heavy snorer and falls asleep easily throughout the day.  He describes witnessed apneas.  Past Medical History:  Diagnosis Date  . Hypertension     Past Surgical History:  Procedure Laterality Date  . APPENDECTOMY    . CESAREAN SECTION      MEDICATIONS: I have reviewed all medications and confirmed regimen as documented  Social History   Socioeconomic History  . Marital status: Single    Spouse name: Not on file  . Number of children: Not on file  . Years of education: Not on file  . Highest education level: Not on file  Social Needs  . Financial resource strain: Not on file  . Food insecurity - worry: Not on file  . Food insecurity - inability: Not on file  . Transportation needs - medical: Not on file  . Transportation needs - non-medical: Not on file  Occupational History  . Not on file  Tobacco Use  . Smoking status: Former Smoker    Packs/day: 1.00    Types: Cigarettes  . Smokeless tobacco: Never Used  Substance and Sexual Activity  . Alcohol use: No  . Drug use: No  . Sexual activity: Not on file  Other Topics Concern  . Not on file  Social History Narrative  . Not on file    History reviewed. No pertinent family  history.  ROS: Level 5 caveat   Vitals:   12/26/17 0744 12/26/17 1517 12/26/17 1517 12/26/17 1604  BP: 116/70 (!) 94/56 (!) 94/56 (!) 145/90  Pulse: 92  93 82  Resp: 19  16 (!) 21  Temp: 98.1 F (36.7 C)  98.6 F (37 C) 99.2 F (37.3 C)  TempSrc: Oral   Axillary  SpO2: 97%   95%  Weight:      Height:         EXAM:  Gen: Severely obese, lethargic, on BiPAP HEENT: NCAT, sclerae are severely injected Neck: Markedly increased neck circumference, jugular venous pulsations cannot be visualized Lungs: No wheezes or adventitious sounds anteriorly Cardiovascular: Heart sounds are markedly reduced, no M noted Abdomen: Obese, soft, nontender, diminished BS Ext: Symmetric bilateral pitting pretibial, ankle, pedal edema Neuro: CNs grossly intact, moves all extremities Skin: Limited exam, no lesions noted  DATA:   BMP Latest Ref Rng & Units 12/26/2017 12/25/2017 05/29/2017  Glucose 65 - 99 mg/dL 119(H) 80 138(H)  BUN 6 - 20 mg/dL 16 16 23(H)  Creatinine 0.44 - 1.00 mg/dL 1.10(H) 1.06(H) 0.93  Sodium 135 - 145 mmol/L 141 139 136  Potassium 3.5 - 5.1 mmol/L 4.7 4.3 3.8  Chloride 101 - 111 mmol/L 96(L) 97(L) 98(L)  CO2 22 - 32 mmol/L 37(H) 34(H) 29  Calcium 8.9 - 10.3 mg/dL 8.2(L) 8.4(L) 9.3    CBC Latest Ref Rng & Units 12/25/2017 05/29/2017  03/24/2016  WBC 3.6 - 11.0 K/uL 7.5 8.3 10.7  Hemoglobin 12.0 - 16.0 g/dL 16.1(H) 14.9 12.9  Hematocrit 35.0 - 47.0 % 51.8(H) 46.2 39.7  Platelets 150 - 440 K/uL 251 273 298    CXR: Cardiomegaly, vascular congestion, interstitial prominence  IMPRESSION:   This is full-blown Pickwickian syndrome with OSA, OHS, acute on chronic hypercarbic respiratory failure, pulmonary hypertension by echocardiogram (suggesting long-standing chronic hypoxemia), polycythemia (again, due to long-standing chronic hypoxemia), right ventricular failure.  She is now profoundly decompensated.  Presently supported on BiPAP but at high risk for requiring intubation due to  persistent lethargy.  Smoker - Per her husband, she smokes fewer than 5 cigarettes/day   PLAN:  Continue noninvasive ventilatory support Monitor closely in SDU  We need to be cautious with loop diuretics as they will cause progressive metabolic alkalosis "allowing" her to hyperventilate even further  I spoke at length with her husband and sister at the bedside.  I emphasized that we are dealing with the consequences of morbid obesity.  I implored that we seek long-term management strategies for this problem.  At the time of discharge, she should be discharged home with nocturnal BiPAP for OSA/OHS and should have a sleep study scheduled.  She will need follow-up in the pulmonary office.  At the time of discharge, we should also have established a plan and strategy to aggressively address morbid obesity.  At some point, she should be considered for gastric bypass procedure or other bariatric procedure.   Merton Border, MD PCCM service Mobile [phone] Pager [phone] 12/26/2017 4:59 PM

## 2017-12-26 NOTE — Plan of Care (Signed)
  Progressing Education: Knowledge of General Education information will improve 12/26/2017 1105 - Progressing by Liliane Channel, RN Clinical Measurements: Will remain free from infection 12/26/2017 1105 - Progressing by Liliane Channel, RN Activity: Risk for activity intolerance will decrease 12/26/2017 1105 - Progressing by Liliane Channel, RN Pain Managment: General experience of comfort will improve 12/26/2017 1105 - Progressing by Liliane Channel, RN Safety: Ability to remain free from injury will improve 12/26/2017 1105 - Progressing by Liliane Channel, RN

## 2017-12-26 NOTE — Progress Notes (Signed)
Pt was supposed to have lasix 80 mg but the BP was 94/56. Docotor Vacchani was notified and states to hold Lasix. Will continue to monitor.

## 2017-12-26 NOTE — Progress Notes (Signed)
Pt was transferred to ICU.

## 2017-12-26 NOTE — Discharge Instructions (Signed)
Heart Failure Clinic appointment on January 04 2018 at 8:20am with Darylene Price, [place]. Please call [phone] to reschedule.

## 2017-12-26 NOTE — Progress Notes (Signed)
[place] [facility] [place] NAME: Christine Oneill    MR#:  [id number]  DATE OF BIRTH:  03/01/1961  SUBJECTIVE:  CHIEF COMPLAINT:   Chief Complaint  Patient presents with  . Shortness of Breath   Came with worsening SOB for few weeks, but very lethargic since 1 days. Have excessive generalized edema and noted to be in CHF. Very lethargic when I saw around 12 noon time.  REVIEW OF SYSTEMS:  Lethargic, can not give ROS.  ROS  DRUG ALLERGIES:  No Known Allergies  VITALS:  Blood pressure (!) 145/90, pulse 82, temperature 99.2 F (37.3 C), temperature source Axillary, resp. rate (!) 21, height 5\' 3"  (1.6 m), weight (!) 157.7 kg (347 lb 9.6 oz), SpO2 95 %.  PHYSICAL EXAMINATION:  GENERAL:  57 y.o.-year-old morbidly obese patient lying in the bed with no acute distress. lethargic. EYES: Pupils equal, round, reactive to light and accommodation. No scleral icterus. Extraocular muscles intact.  HEENT: Head atraumatic, normocephalic. Oropharynx and nasopharynx clear.  NECK:  Supple, no jugular venous distention. No thyroid enlargement, no tenderness.  LUNGS: Normal breath sounds bilaterally, no wheezing, rales,rhonchi or crepitation. No use of accessory muscles of respiration.  CARDIOVASCULAR: S1, S2 normal. No murmurs, rubs, or gallops.  ABDOMEN: Soft, nontender, nondistended. Bowel sounds present. No organomegaly or mass.  EXTREMITIES: severe pedal edema, no cyanosis, or clubbing.  NEUROLOGIC: pt barely opens eyes, and goes back to sleep. PSYCHIATRIC: The patient is lethargic.  SKIN: No obvious rash, lesion, or ulcer.   Physical Exam LABORATORY PANEL:   CBC Recent Labs  Lab 12/25/17 1807  WBC 7.5  HGB 16.1*  HCT 51.8*  PLT 251   ------------------------------------------------------------------------------------------------------------------  Chemistries  Recent Labs  Lab 12/26/17 1237  NA 141  K 4.7  CL 96*  CO2 37*  GLUCOSE 119*   BUN 16  CREATININE 1.10*  CALCIUM 8.2*   ------------------------------------------------------------------------------------------------------------------  Cardiac Enzymes Recent Labs  Lab 12/26/17 0347 12/26/17 0918  TROPONINI <0.03 <0.03   ------------------------------------------------------------------------------------------------------------------  RADIOLOGY:  Dg Chest 2 View  Result Date: 12/25/2017 CLINICAL DATA:  Leg swelling.  Chest pain and hypoxia EXAM: CHEST  2 VIEW COMPARISON:  05/29/2017 FINDINGS: Cardiomegaly, chronic. There is diffuse interstitial opacity that is progressed. No effusion or air bronchograms. Mild scarring in the left mid lung. No pneumothorax. Vascular pedicle widening. IMPRESSION: CHF pattern. Electronically Signed   By: Monte Fantasia M.D.   On: 12/25/2017 18:48    ASSESSMENT AND PLAN:   Active Problems:   CHF (congestive heart failure) (HCC)   Pulmonary hypertension (HCC)   Obesity hypoventilation syndrome (HCC)   Smoker  Christine Oneill  is a 57 y.o. female with a known history of hypertension, chronic smoker comes to the emergency room with increasing shortness of breath leg swelling and feeling bloated all over with weight gain. Reports the symptoms going on for a few weeks however felt poorly with shortness of breath comes to the emergency room found to be in new-onset congestive heart failure  * Ac hypercapneic respi failure   Need bipap.   Transfer to stepdown unit.  *  Acute hypoxic respiratory failure secondary to CHF ac diastolic -Came in with increasing shortness of breath, leg swelling and feeling bloated with sats 80% on room air in the emergency room -BNP elevated chest x-ray consistent with bilateral pulmonary vascular congestion -IV Lasix 40mg  3 times daily -Blood pressure soft and holding of beta blockers and ACE inhibitor -Aspirin -Cardiology consultation  appreciated. -Echo- increased right sided pressure. -Troponin x3,  TSH  *  Morbidly obesity with possible underlying sleep apnea -Patient will benefit from sleep study as outpatient.    *  Hypertension -Blood pressure a bit on the softer side will hold off on BP meds  *  Elevated H&H -Suspect secondary polycythemia in the setting of smoking -Continue to monitor  *  Tobacco abuse smoking cessation advised about 4 minutes spent  *  DVT prophylaxis subcu Lovenox    All the records are reviewed and case discussed with Care Management/Social Workerr. Management plans discussed with the patient, family and they are in agreement.  CODE STATUS: Full.  TOTAL TIME TAKING CARE OF THIS PATIENT: 45 critical care minutes.  discussed with multiple family memberss in room twice.  POSSIBLE D/C IN 2-3 DAYS, DEPENDING ON CLINICAL CONDITION.   Vaughan Basta M.D on 12/26/2017   Between 7am to 6pm - Pager - [phone]  After 6pm go to [email or web address] - password [email or web address] [facility]  Office  [phone]  CC: Primary care physician; [facility], [place]  Note: This dictation was prepared with Diplomatic Services operational officer dictation along with smaller phrase technology. Any transcriptional errors that result from this process are unintentional.

## 2017-12-26 NOTE — Progress Notes (Signed)
Pt states she wants to take  The penumonia vaccine, but upon administration pt states she wants to get the vaccine upon discharge. Will continue to monitor.

## 2017-12-26 NOTE — Care Management Note (Signed)
Case Management Note  Patient Details  Name: Christine Oneill MRN: [id number] Date of Birth: Dec 03, 1961  Subjective/Objective:                 Admitted from home with new diagnosis of CHF.   Current supplemental oxygen is acute. She does not have access to scales. During assessment patient is not able to stay awake. Her family in the room answers for her. Informed that patient is followed at [facility] and no issues getting to appointments and no issues obtaining medications.  Is able to ambulate in the home without assistive device. Has a female companion that lives in the home with her.   Discussed with primary nurse and informed ABGs have have been ordered.  Patient is not able to participate at present in conversation due to drowsiness  Action/Plan:  Anticipate need to give scales.  Provided Living with Heart Failure Education Booklet.  Placed diet referral  Expected Discharge Date:                  Expected Discharge Plan:     In-House Referral:     Discharge planning Services     Post Acute Care Choice:    Choice offered to:     DME Arranged:    DME Agency:     HH Arranged:    HH Agency:     Status of Service:     If discussed at [employer], dates discussed:    Additional Comments:  Katrina Stack, RN 12/26/2017, 8:49 AM

## 2017-12-26 NOTE — Progress Notes (Signed)
Pt was a little bit hard to wake up around 9:30 when doctor Vacchani was rounding. Doctor Marthann Schiller ordered AVG and it came back abnormal. Pt was also on Lasix 40 mg, but pt only went one time to pee. Doctor Buck Mam ask me to bladder scan her ( urine amt was at > 296). Doctor Marthann Schiller was notified and no order was place. He did states that he will increase the dose for lasix. Just waiting for the order.  Pt is going to transfer to CCU. Just waiting for bed.

## 2017-12-26 NOTE — Progress Notes (Signed)
*  PRELIMINARY RESULTS* Echocardiogram 2D Echocardiogram has been performed.  Christine Oneill 12/26/2017, 9:29 AM

## 2017-12-26 NOTE — Care Management (Signed)
Patient is being transferred to icu due to decline in respiratory status, PC02 108, 02 46% Have discussed home cpap with Corene Cornea from [facility].

## 2017-12-26 NOTE — Consult Note (Signed)
[facility] Consultation Note  Patient ID: Christine Oneill, MRN: [id number], DOB/AGE: Mar 18, 1961 57 y.o. Admit date: 12/25/2017   Date of Consult: 12/26/2017 Primary Physician: [facility] [place] Primary Cardiologist: Nehemiah Massed  Chief Complaint:  Chief Complaint  Patient presents with  . Shortness of Breath   Reason for Consult: Acute congestive heart failure  HPI: 57 y.o. female with known essential hypertension mixed hyperlipidemia severe sleep apnea and previous history of tobacco abuse having significant progression of shortness of breath abdominal bloating and lower over the last several weeks to a month.  The patient initially was placed on furosemide although this is not significantly improved her outcomes and shortness of breath and edema.  She had significant progression of symptoms requiring admission from the emergency room.  At that time she had a chest x-ray suggesting bilateral pulmonary edema and cardiomegaly.  Echocardiogram today suggest normal LV systolic function diastolic dysfunction and significant enlargement of right side of the heart with right-sided heart failure and elevated pulmonary pressures.  Provement with diuresis and oxygenation.  There is been no evidence of myocardial infarction at this time  Past Medical History:  Diagnosis Date  . Hypertension       Surgical History:  Past Surgical History:  Procedure Laterality Date  . APPENDECTOMY    . CESAREAN SECTION       Home Meds: Prior to Admission medications   Medication Sig Start Date End Date Taking? Authorizing Provider  hydrochlorothiazide (HYDRODIURIL) 12.5 MG tablet Take 12.5 mg by mouth daily.   Yes [provider]  ibuprofen (ADVIL,MOTRIN) 600 MG tablet Take 1 tablet (600 mg total) by mouth every 8 (eight) hours as needed. Patient not taking: Reported on 12/25/2017 05/29/17   Darel Hong, MD    Inpatient Medications:  . aspirin EC  81 mg Oral Daily  .  enoxaparin (LOVENOX) injection  40 mg Subcutaneous Q12H  . furosemide  40 mg Intravenous Q8H  . pneumococcal 23 valent vaccine  0.5 mL Intramuscular Tomorrow-1000     Allergies: No Known Allergies  Social History   Socioeconomic History  . Marital status: Single    Spouse name: Not on file  . Number of children: Not on file  . Years of education: Not on file  . Highest education level: Not on file  Social Needs  . Financial resource strain: Not on file  . Food insecurity - worry: Not on file  . Food insecurity - inability: Not on file  . Transportation needs - medical: Not on file  . Transportation needs - non-medical: Not on file  Occupational History  . Not on file  Tobacco Use  . Smoking status: Former Smoker    Packs/day: 1.00    Types: Cigarettes  . Smokeless tobacco: Never Used  Substance and Sexual Activity  . Alcohol use: No  . Drug use: No  . Sexual activity: Not on file  Other Topics Concern  . Not on file  Social History Narrative  . Not on file     History reviewed. No pertinent family history.   Review of Systems Positive for shortness of breath weight gain PND orthopnea Negative for: General:  chills, fever, night sweats or positive for weight changes.  Cardiovascular: Positive for PND orthopnea negative for syncope dizziness  Dermatological skin lesions rashes Respiratory: Cough congestion Urologic: Frequent urination urination at night and hematuria Abdominal: negative for nausea, vomiting, diarrhea, bright red blood per rectum, melena, or hematemesis Neurologic: negative for visual changes,  and/or hearing changes  All other systems reviewed and are otherwise negative except as noted above.  Labs: Recent Labs    12/25/17 1807 12/25/17 2350 12/26/17 0347  TROPONINI <0.03 <0.03 <0.03   Lab Results  Component Value Date   WBC 7.5 12/25/2017   HGB 16.1 (H) 12/25/2017   HCT 51.8 (H) 12/25/2017   MCV 94.9 12/25/2017   PLT 251 12/25/2017     Recent Labs  Lab 12/25/17 1807  NA 139  K 4.3  CL 97*  CO2 34*  BUN 16  CREATININE 1.06*  CALCIUM 8.4*  GLUCOSE 80   No results found for: CHOL, HDL, LDLCALC, TRIG No results found for: DDIMER  Radiology/Studies:  Dg Chest 2 View  Result Date: 12/25/2017 CLINICAL DATA:  Leg swelling.  Chest pain and hypoxia EXAM: CHEST  2 VIEW COMPARISON:  05/29/2017 FINDINGS: Cardiomegaly, chronic. There is diffuse interstitial opacity that is progressed. No effusion or air bronchograms. Mild scarring in the left mid lung. No pneumothorax. Vascular pedicle widening. IMPRESSION: CHF pattern. Electronically Signed   By: Monte Fantasia M.D.   On: 12/25/2017 18:48    EKG: Normal sinus rhythm with left atrial enlargement anterior lateral T wave inversions  Weights: Filed Weights   12/25/17 1805 12/25/17 2118  Weight: (!) 141.5 kg (312 lb) (!) 157.7 kg (347 lb 9.6 oz)     Physical Exam: Blood pressure 116/70, pulse 92, temperature 98.1 F (36.7 C), temperature source Oral, resp. rate 19, height 5\' 3"  (1.6 m), weight (!) 157.7 kg (347 lb 9.6 oz), SpO2 97 %. Body mass index is 61.57 kg/m. General: Well developed, well nourished, in no acute distress. Head eyes ears nose throat: Normocephalic, atraumatic, sclera non-icteric, no xanthomas, nares are without discharge. No apparent thyromegaly and/or mass.  Patient does have some edema and sclera  Lungs: Normal respiratory effort.  Some wheezes, few rales, no rhonchi.  Heart: RRR with normal S1 S2. no murmur gallop, no rub, PMI is normal size and placement, carotid upstroke normal without bruit, jugular venous pressure is normal Abdomen: Soft, non-tender,  distended with normoactive bowel sounds. No hepatomegaly. No rebound/guarding. No obvious abdominal masses. Abdominal aorta is normal size without bruit Extremities: 1-2+ edema. no cyanosis, no clubbing, no ulcers  Peripheral : 2+ bilateral upper extremity pulses, 0+ bilateral femoral pulses, 0+  bilateral dorsal pedal pulse Neuro: Alert and oriented. No facial asymmetry. No focal deficit. Moves all extremities spontaneously. Musculoskeletal: Normal muscle tone without kyphosis Psych:  Responds to questions appropriately with a normal affect.    Assessment: 57 year old female with acute right-sided heart failure with likely diastolic dysfunction heart failure as well and hypoxia without evidence of myocardial infarction  Plan: 1.  Intravenous Lasix for pulmonary edema and diastolic dysfunction congestive heart failure 2.  Further evaluation of causes of significant pulmonary hypertension and right-sided heart failure and enlargement of right heart with possible cardiac catheterization as needed 3.  Continue hypertension control with current medical regimen 4.  Continue further evaluation and treatment for pulmonary cause of heart failure and dilated right ventricle and right atrium 5.  Further treatment options after above  Signed, Corey Skains M.D. [facility] 12/26/2017, 8:34 AM

## 2017-12-27 ENCOUNTER — Encounter: Payer: Self-pay | Admitting: *Deleted

## 2017-12-27 DIAGNOSIS — I272 Pulmonary hypertension, unspecified: Secondary | ICD-10-CM

## 2017-12-27 LAB — CBC
HEMATOCRIT: 51.9 % — AB (ref 35.0–47.0)
HEMOGLOBIN: 15.7 g/dL (ref 12.0–16.0)
MCH: 29.1 pg (ref 26.0–34.0)
MCHC: 30.3 g/dL — AB (ref 32.0–36.0)
MCV: 96.1 fL (ref 80.0–100.0)
Platelets: 231 10*3/uL (ref 150–440)
RBC: 5.4 MIL/uL — ABNORMAL HIGH (ref 3.80–5.20)
RDW: 20.4 % — AB (ref 11.5–14.5)
WBC: 5.7 10*3/uL (ref 3.6–11.0)

## 2017-12-27 LAB — BASIC METABOLIC PANEL
ANION GAP: 6 (ref 5–15)
BUN: 15 mg/dL (ref 6–20)
CALCIUM: 8.7 mg/dL — AB (ref 8.9–10.3)
CO2: 41 mmol/L — ABNORMAL HIGH (ref 22–32)
Chloride: 95 mmol/L — ABNORMAL LOW (ref 101–111)
Creatinine, Ser: 1.05 mg/dL — ABNORMAL HIGH (ref 0.44–1.00)
GFR calc Af Amer: >60 mL/min (ref >60)
GFR, EST NON AFRICAN AMERICAN: 59 mL/min — AB (ref >60)
Glucose, Bld: 123 mg/dL — ABNORMAL HIGH (ref 65–99)
Potassium: 5 mmol/L (ref 3.5–5.1)
Sodium: 142 mmol/L (ref 135–145)

## 2017-12-27 MED ORDER — NICOTINE 14 MG/24HR TD PT24
14.0000 mg | MEDICATED_PATCH | Freq: Every day | TRANSDERMAL | Status: DC
  Administered 2017-12-27 – 2017-12-28 (×2): 14 mg via TRANSDERMAL
  Filled 2017-12-27 (×2): qty 1

## 2017-12-27 MED ORDER — METOPROLOL SUCCINATE ER 25 MG PO TB24
12.5000 mg | ORAL_TABLET | Freq: Every day | ORAL | Status: DC
  Administered 2017-12-27 – 2017-12-29 (×3): 12.5 mg via ORAL
  Filled 2017-12-27 (×3): qty 1

## 2017-12-27 NOTE — Progress Notes (Addendum)
Pulmonary/critical care  Review. Patient meets criteria for obesity hypoventilation syndrome. Has not had a diagnosis of obstructive sleep apnea or home CPAP. Her respiratory failure is chronic as is documented by her bicarbonate on her BMP at 41 along with a pH of 7.2 with a PCO2 of 108. If this was purely acute it would be much lower. Patient has chronic hypercapnic respiratory failure with obesity hypoventilation syndrome based on PCO2 and BMI. She needs nocturnal noninvasive ventilation. Would recommend BiPAP at night, upon discharge patient should have an outpatient polysomnogram with diagnosis and titration performed. Will obtain repeat arterial blood gas, overnight oximetry on nasal cannula.  Christine Oneill, D.O.

## 2017-12-27 NOTE — Progress Notes (Addendum)
[place] [facility] [place] NAME: Christine Oneill    MR#:  [id number]  DATE OF BIRTH:  Apr 19, 1961  SUBJECTIVE:   Feels better. Sitting up Leg edema+ Off BIPAP REVIEW OF SYSTEMS:   Review of Systems  Constitutional: Negative for chills, fever and weight loss.  HENT: Negative for ear discharge, ear pain and nosebleeds.   Eyes: Negative for blurred vision, pain and discharge.  Respiratory: Positive for shortness of breath. Negative for sputum production, wheezing and stridor.   Cardiovascular: Positive for orthopnea and leg swelling. Negative for chest pain, palpitations and PND.  Gastrointestinal: Negative for abdominal pain, diarrhea, nausea and vomiting.  Genitourinary: Negative for frequency and urgency.  Musculoskeletal: Negative for back pain and joint pain.  Neurological: Positive for weakness. Negative for sensory change, speech change and focal weakness.  Psychiatric/Behavioral: Negative for depression and hallucinations. The patient is not nervous/anxious.    Tolerating Diet:yes Tolerating PT: pending  DRUG ALLERGIES:  No Known Allergies  VITALS:  Blood pressure 128/84, pulse 72, temperature 98.3 F (36.8 C), temperature source Axillary, resp. rate (!) 23, height 5\' 3"  (1.6 m), weight (!) 152 kg (335 lb 1.6 oz), SpO2 98 %.  PHYSICAL EXAMINATION:   Physical Exam  GENERAL:  57 y.o.-year-old patient lying in the bed with no acute distress. Morbidly obese EYES: Pupils equal, round, reactive to light and accommodation. No scleral icterus. Extraocular muscles intact.  HEENT: Head atraumatic, normocephalic. Oropharynx and nasopharynx clear.  NECK:  Supple, no jugular venous distention. No thyroid enlargement, no tenderness.  LUNGS: Normal breath sounds bilaterally, no wheezing, rales, rhonchi. No use of accessory muscles of respiration.  CARDIOVASCULAR: S1, S2 normal. No murmurs, rubs, or gallops.  ABDOMEN: Soft, nontender,  nondistended. Bowel sounds present. No organomegaly or mass.  EXTREMITIES: No cyanosis, clubbing o +++edema b/l.    NEUROLOGIC: Cranial nerves II through XII are intact. No focal Motor or sensory deficits b/l.   PSYCHIATRIC:  patient is alert and oriented x 3.  SKIN: No obvious rash, lesion, or ulcer.   LABORATORY PANEL:  CBC Recent Labs  Lab 12/27/17 0456  WBC 5.7  HGB 15.7  HCT 51.9*  PLT 231    Chemistries  Recent Labs  Lab 12/27/17 0456  NA 142  K 5.0  CL 95*  CO2 41*  GLUCOSE 123*  BUN 15  CREATININE 1.05*  CALCIUM 8.7*   Cardiac Enzymes Recent Labs  Lab 12/26/17 0918  TROPONINI <0.03   RADIOLOGY:  Dg Chest 2 View  Result Date: 12/25/2017 CLINICAL DATA:  Leg swelling.  Chest pain and hypoxia EXAM: CHEST  2 VIEW COMPARISON:  05/29/2017 FINDINGS: Cardiomegaly, chronic. There is diffuse interstitial opacity that is progressed. No effusion or air bronchograms. Mild scarring in the left mid lung. No pneumothorax. Vascular pedicle widening. IMPRESSION: CHF pattern. Electronically Signed   By: Monte Fantasia M.D.   On: 12/25/2017 18:48   ASSESSMENT AND PLAN:   Christine Oneill a61 y.o.femalewith a known history of hypertension, chronic smoker comes to the emergency room with increasing shortness of breath leg swelling and feeling bloated all over with weight gain. Reports the symptoms going on for a few weeks however felt poorly with shortness of breath comes to the emergency room found to be in new-onset congestive heart failure  * Ac hypercapneic respi failure   was on bipap--now on [place] oxygen.  * Acute hypoxic respiratory failure secondary to CHF ac diastolic -Came in with increasing shortness of breath, leg  swelling and feeling bloated with sats 80% on room air in the emergency room -BNP elevated chest x-ray consistent with bilateral pulmonary vascular congestion -IV Lasix 40mg  3 times daily -Blood pressure soft and holding of beta blockers and ACE  inhibitor -Aspirin -Cardiology consultation appreciated. -Echo- increased right sided pressure. -Troponin x3, TSH normal  * Morbidly obesity with possible underlying sleep apnea -Patient will benefit from sleep study as outpatient.  -per Dr Jefferson Fuel pt needs NIVV  * Hypertension -start BB  * Elevated H&H -Suspect secondary polycythemia in the setting of smoking -Continue to monitor  * Tobacco abuse smoking cessation advised about 4 minutes spent  * DVT prophylaxis subcu Lovenox     Case discussed with Care Management/Social Worker. Management plans discussed with the patient, family and they are in agreement.  CODE STATUS: full  DVT Prophylaxis:lovenox   TOTAL TIME TAKING CARE OF THIS PATIENT:  minutes.  >50% time spent on counselling and coordination of care  POSSIBLE D/C IN 2-3 DAYS, DEPENDING ON CLINICAL CONDITION.  Note: This dictation was prepared with Dragon dictation along with smaller phrase technology. Any transcriptional errors that result from this process are unintentional.  Fritzi Mandes M.D on 12/27/2017 at 1:16 PM  Between 7am to 6pm - Pager - [phone]  After 6pm go to [email or web address] - password [email or web address] [facility]  Office  [phone]  CC: Primary care physician; [facility]Patient ID: Christine Oneill, female   DOB: [birth year], 57 y.o.   MRN: [id number]

## 2017-12-27 NOTE — Progress Notes (Addendum)
  [facility] Progess Note    SYNOPSIS   57 y.o. female smoker with documented history only of hypertension.  Admitted 01/07 with increasing lower extremity edema and shortness of breath.  Transferred to ICU/SDU 01/08 with progressive lethargy and ABG revealing severe hypercarbia  ASSESSMENT/PLAN   Respiratory failure. Patient with clear obesity hypoventilation syndrome, acute on chronic decompensation with elevated CO2 and reduced pH, on BMP CO2 is elevated, HCRT is also elevated may reflect chronic hypoxemia. Wean noninvasive ventilation as tolerated, patient will need nocturnal BiPAP and an outpatient polysomnogram with titration. Has had 1.9 L diuresis with significant improvement. We'll continue diuretic therapy. Echocardiography revealed preserved left ventricular ejection fraction, moderate tricuspid regurg, suspect pulmonary hypertension  Name: Christine Oneill MRN: [id number] DOB: 1961/01/21    ADMISSION DATE:  12/25/2017    SUBJECTIVE:   Patient states that she is feeling better and is hungry, is presently on noninvasive ventilation, is awake and alert and communicating  VITAL SIGNS: Temp:  [97.8 F (36.6 C)-99.2 F (37.3 C)] 98.3 F (36.8 C) (01/09 0715) Pulse Rate:  [55-93] 71 (01/09 0715) Resp:  [11-28] 23 (01/09 0715) BP: (94-145)/(56-117) 128/84 (01/09 0700) SpO2:  [1 %-98 %] 97 % (01/09 0715) FiO2 (%):  [55 %] 55 % (01/08 1921) Weight:  [152 kg (335 lb 1.6 oz)-159.5 kg (351 lb 10.1 oz)] 152 kg (335 lb 1.6 oz) (01/09 0500)   PHYSICAL EXAMINATION: Physical Examination:   VS: BP 128/84   Pulse 71   Temp 98.3 F (36.8 C) (Axillary)   Resp (!) 23   Ht 5\' 3"  (1.6 m)   Wt (!) 152 kg (335 lb 1.6 oz)   SpO2 97%   BMI 59.36 kg/m   General Appearance: No distress  Neuro:without focal findings, mental status normal. HEENT: Exophthalmus noted Pulmonary: normal breath sounds   CardiovascularNormal S1,S2.  No m/r/g.   Abdomen: Benign, Soft,  non-tender. Morbid obesity noted Skin:   warm, no rashes, no ecchymosis  Extremities: normal, no cyanosis, clubbing.    LABORATORY PANEL:   CBC Recent Labs  Lab 12/27/17 0456  WBC 5.7  HGB 15.7  HCT 51.9*  PLT 231    Chemistries  Recent Labs  Lab 12/27/17 0456  NA 142  K 5.0  CL 95*  CO2 41*  GLUCOSE 123*  BUN 15  CREATININE 1.05*  CALCIUM 8.7*    Recent Labs  Lab 12/26/17 1606  GLUCAP 116*   Recent Labs  Lab 12/26/17 1229  PHART 7.20*  PCO2ART 108*  PO2ART 46*   No results for input(s): AST, ALT, ALKPHOS, BILITOT, ALBUMIN in the last 168 hours.  Cardiac Enzymes Recent Labs  Lab 12/26/17 0918  TROPONINI <0.03    RADIOLOGY:  Dg Chest 2 View  Result Date: 12/25/2017 CLINICAL DATA:  Leg swelling.  Chest pain and hypoxia EXAM: CHEST  2 VIEW COMPARISON:  05/29/2017 FINDINGS: Cardiomegaly, chronic. There is diffuse interstitial opacity that is progressed. No effusion or air bronchograms. Mild scarring in the left mid lung. No pneumothorax. Vascular pedicle widening. IMPRESSION: CHF pattern. Electronically Signed   By: Monte Fantasia M.D.   On: 12/25/2017 18:48   Hermelinda Dellen, DO 12/27/2017

## 2017-12-27 NOTE — Progress Notes (Signed)
Pt requesting nicotine patch, smokes 6-8 cigarettes a day. MD paged. Dr. Jannifer Franklin to put in orders for a nicotine patch daily. Will continue to monitor. Conley Simmonds, RN, BSN

## 2017-12-27 NOTE — Progress Notes (Signed)
[facility] Encounter Note  Patient: Christine Oneill / Admit Date: 12/25/2017 / Date of Encounter: 12/27/2017, 8:34 AM   Subjective: Patient had significant decompensation last night with hypoxia and hypercarbia.  This is suggestive of chronic hypoxemia due to significant obesity and sleep apnea.  Patient has had significant increase in lower extremity edema and some mild pulmonary edema from this issue.  No evidence of myocardial infarction at this time Echocardiogram showing preserved LV systolic function with significant enlargement of right ventricle right atrium with severe pulmonary hypertension and tricuspid regurgitation assistant with pulmonary source of right-sided heart failure  Review of Systems: Positive for: Shortness of breath obtundation Negative for: Vision change, hearing change, syncope, dizziness, nausea, vomiting,diarrhea, bloody stool, stomach pain, cough, congestion, diaphoresis, urinary frequency, urinary pain,skin lesions, skin rashes Others previously listed  Objective: Telemetry: Sinus rhythm Physical Exam: Blood pressure 128/84, pulse 71, temperature 98.3 F (36.8 C), temperature source Axillary, resp. rate (!) 23, height 5\' 3"  (1.6 m), weight (!) 152 kg (335 lb 1.6 oz), SpO2 97 %. Body mass index is 59.36 kg/m. General: Well developed, well nourished, in no acute distress. Head: Normocephalic, atraumatic, sclera non-icteric, no xanthomas, nares are without discharge. Neck: No apparent masses Lungs: Normal respirations with diffuse wheezes, no rhonchi, no rales , few crackles   Heart: Regular rate and rhythm, normal S1 S2, no murmur, no rub, no gallop, PMI is normal size and placement, carotid upstroke normal without bruit, jugular venous pressure normal Abdomen: Soft, non-tender, -distended with normoactive bowel sounds. No hepatosplenomegaly. Abdominal aorta is normal size without bruit Extremities: 1-2+ edema, no clubbing, no cyanosis, no  ulcers,  Peripheral: 2+ radial, 2+ femoral, 2+ dorsal pedal pulses Neuro: Alert and oriented. Moves all extremities spontaneously. Psych:  Responds to questions appropriately with a normal affect.   Intake/Output Summary (Last 24 hours) at 12/27/2017 0834 Last data filed at 12/27/2017 0500 Gross per 24 hour  Intake 360 ml  Output 2350 ml  Net -1990 ml    Inpatient Medications:  . aspirin EC  81 mg Oral Daily  . enoxaparin (LOVENOX) injection  40 mg Subcutaneous Q12H  . furosemide  40 mg Intravenous Q8H  . furosemide  80 mg Intravenous Once  . pneumococcal 23 valent vaccine  0.5 mL Intramuscular Tomorrow-1000   Infusions:   Labs: Recent Labs    12/26/17 1237 12/27/17 0456  NA 141 142  K 4.7 5.0  CL 96* 95*  CO2 37* 41*  GLUCOSE 119* 123*  BUN 16 15  CREATININE 1.10* 1.05*  CALCIUM 8.2* 8.7*   No results for input(s): AST, ALT, ALKPHOS, BILITOT, PROT, ALBUMIN in the last 72 hours. Recent Labs    12/25/17 1807 12/27/17 0456  WBC 7.5 5.7  HGB 16.1* 15.7  HCT 51.8* 51.9*  MCV 94.9 96.1  PLT 251 231   Recent Labs    12/25/17 1807 12/25/17 2350 12/26/17 0347 12/26/17 0918  TROPONINI <0.03 <0.03 <0.03 <0.03   Invalid input(s): POCBNP No results for input(s): HGBA1C in the last 72 hours.   Weights: Filed Weights   12/25/17 2118 12/26/17 1700 12/27/17 0500  Weight: (!) 157.7 kg (347 lb 9.6 oz) (!) 159.5 kg (351 lb 10.1 oz) (!) 152 kg (335 lb 1.6 oz)     Radiology/Studies:  Dg Chest 2 View  Result Date: 12/25/2017 CLINICAL DATA:  Leg swelling.  Chest pain and hypoxia EXAM: CHEST  2 VIEW COMPARISON:  05/29/2017 FINDINGS: Cardiomegaly, chronic. There is diffuse interstitial opacity that is  progressed. No effusion or air bronchograms. Mild scarring in the left mid lung. No pneumothorax. Vascular pedicle widening. IMPRESSION: CHF pattern. Electronically Signed   By: Monte Fantasia M.D.   On: 12/25/2017 18:48     Assessment and Recommendation  57 y.o. female with  acute right-sided heart failure secondary to severe pulmonary hypertension hypoxia and hypercarbia from sleep apnea and obesity now slightly improved with intensive care treatment without evidence of myocardial infarction 1.  Addition of further diuresis only as necessary due to concerns of significant decrease in volume status with right-sided heart failure rather than left-sided heart failure 2.  Primary treatment with BiPAP as per pulmonary and intensive care 3.  No further cardiac interventions in her diagnostics necessary at this time 4.  No restrictions from cardiac standpoint for rehabilitation and further intervention including treatment of obesity  Signed, Serafina Royals M.D. FACC

## 2017-12-27 NOTE — Care Management (Signed)
Referral to [facility] to assess for home NIV.

## 2017-12-28 LAB — GLUCOSE, CAPILLARY: Glucose-Capillary: 164 mg/dL — ABNORMAL HIGH (ref 65–99)

## 2017-12-28 NOTE — Progress Notes (Signed)
[facility] Encounter Note  Patient: Christine Oneill / Admit Date: 12/25/2017 / Date of Encounter: 12/28/2017, 12:49 PM   Subjective: Patient is significantly improved after longer periods of BiPAP and treatment with oxygen.  No evidence of cough or congestion today.  Patient does have some sciatic pain in her left leg and with continued mild edema unchanged from before Echocardiogram showing preserved LV systolic function with significant enlargement of right ventricle right atrium with severe pulmonary hypertension and tricuspid regurgitation assistant with pulmonary source of right-sided heart failure  Review of Systems: Positive for: Shortness of breath left leg pain Negative for: Vision change, hearing change, syncope, dizziness, nausea, vomiting,diarrhea, bloody stool, stomach pain, cough, congestion, diaphoresis, urinary frequency, urinary pain,skin lesions, skin rashes Others previously listed  Objective: Telemetry: Sinus rhythm Physical Exam: Blood pressure 125/78, pulse 79, temperature 97.8 F (36.6 C), temperature source Oral, resp. rate 18, height 5\' 3"  (1.6 m), weight (!) 152.1 kg (335 lb 6.4 oz), SpO2 92 %. Body mass index is 59.41 kg/m. General: Well developed, well nourished, in no acute distress. Head: Normocephalic, atraumatic, sclera non-icteric, no xanthomas, nares are without discharge. Neck: No apparent masses Lungs: Normal respirations with diffuse wheezes, no rhonchi, no rales , few crackles   Heart: Regular rate and rhythm, normal S1 S2, no murmur, no rub, no gallop, PMI is normal size and placement, carotid upstroke normal without bruit, jugular venous pressure normal Abdomen: Soft, non-tender, -distended with normoactive bowel sounds. No hepatosplenomegaly. Abdominal aorta is normal size without bruit Extremities: 1-2+ edema, no clubbing, no cyanosis, no ulcers,  Peripheral: 2+ radial, 2+ femoral, 2+ dorsal pedal pulses Neuro: Alert and oriented.  Moves all extremities spontaneously. Psych:  Responds to questions appropriately with a normal affect.   Intake/Output Summary (Last 24 hours) at 12/28/2017 1249 Last data filed at 12/28/2017 1018 Gross per 24 hour  Intake 240 ml  Output 3300 ml  Net -3060 ml    Inpatient Medications:  . aspirin EC  81 mg Oral Daily  . enoxaparin (LOVENOX) injection  40 mg Subcutaneous Q12H  . furosemide  40 mg Intravenous Q8H  . furosemide  80 mg Intravenous Once  . metoprolol succinate  12.5 mg Oral Daily  . nicotine  14 mg Transdermal Daily  . pneumococcal 23 valent vaccine  0.5 mL Intramuscular Tomorrow-1000   Infusions:   Labs: Recent Labs    12/26/17 1237 12/27/17 0456  NA 141 142  K 4.7 5.0  CL 96* 95*  CO2 37* 41*  GLUCOSE 119* 123*  BUN 16 15  CREATININE 1.10* 1.05*  CALCIUM 8.2* 8.7*   No results for input(s): AST, ALT, ALKPHOS, BILITOT, PROT, ALBUMIN in the last 72 hours. Recent Labs    12/25/17 1807 12/27/17 0456  WBC 7.5 5.7  HGB 16.1* 15.7  HCT 51.8* 51.9*  MCV 94.9 96.1  PLT 251 231   Recent Labs    12/25/17 1807 12/25/17 2350 12/26/17 0347 12/26/17 0918  TROPONINI <0.03 <0.03 <0.03 <0.03   Invalid input(s): POCBNP No results for input(s): HGBA1C in the last 72 hours.   Weights: Filed Weights   12/26/17 1700 12/27/17 0500 12/28/17 0625  Weight: (!) 159.5 kg (351 lb 10.1 oz) (!) 152 kg (335 lb 1.6 oz) (!) 152.1 kg (335 lb 6.4 oz)     Radiology/Studies:  Dg Chest 2 View  Result Date: 12/25/2017 CLINICAL DATA:  Leg swelling.  Chest pain and hypoxia EXAM: CHEST  2 VIEW COMPARISON:  05/29/2017 FINDINGS: Cardiomegaly, chronic.  There is diffuse interstitial opacity that is progressed. No effusion or air bronchograms. Mild scarring in the left mid lung. No pneumothorax. Vascular pedicle widening. IMPRESSION: CHF pattern. Electronically Signed   By: Monte Fantasia M.D.   On: 12/25/2017 18:48     Assessment and Recommendation  57 y.o. female with acute  right-sided heart failure secondary to severe pulmonary hypertension hypoxia and hypercarbia from sleep apnea and obesity now slightly improved with intensive care treatment without evidence of myocardial infarction 1.  Addition of further diuresis only as necessary due to concerns of significant decrease in volume status with right-sided heart failure rather than left-sided heart failure although would use compression hose as well as low-dose of furosemide orally as outpatient 2.  Primary treatment with BiPAP as per pulmonary and intensive care 3.  No further cardiac interventions in her diagnostics necessary at this time 4.  No restrictions from cardiac standpoint for rehabilitation and further intervention including treatment of obesity 5.  Okay for discharge for rehabilitation from a cardiac standpoint with follow-up in 1-2 weeks  Signed, Serafina Royals M.D. FACC

## 2017-12-28 NOTE — Care Management (Signed)
Informed that patient has qualified for continuous home oxygen.  She is agreeable to home health nursing services.  No agency preference.  [facility] called [facility] for oxygen, SN .  CM explained the process for qualifying for home cpap and the need to have sleep study schedule prior to discharge.

## 2017-12-28 NOTE — Progress Notes (Signed)
SATURATION QUALIFICATIONS: (This note is used to comply with regulatory documentation for home oxygen)  Patient Saturations on Room Air at Rest = 92%  Patient Saturations on Room Air while Ambulating = 84%  Patient Saturations on 2L Liters of oxygen while Ambulating = 94%  Please briefly explain why patient needs home oxygen: desat on ambulation

## 2017-12-28 NOTE — Progress Notes (Signed)
[facility] [place] NAME: Christine Oneill    MR#:  [id number]  DATE OF BIRTH:  Jan 22, 1961  SUBJECTIVE:   Feels better. Sitting up on bedside commode Leg edema+ Good urine output REVIEW OF SYSTEMS:   Review of Systems  Constitutional: Negative for chills, fever and weight loss.  HENT: Negative for ear discharge, ear pain and nosebleeds.   Eyes: Negative for blurred vision, pain and discharge.  Respiratory: Positive for shortness of breath. Negative for sputum production, wheezing and stridor.   Cardiovascular: Positive for orthopnea and leg swelling. Negative for chest pain, palpitations and PND.  Gastrointestinal: Negative for abdominal pain, diarrhea, nausea and vomiting.  Genitourinary: Negative for frequency and urgency.  Musculoskeletal: Negative for back pain and joint pain.  Neurological: Positive for weakness. Negative for sensory change, speech change and focal weakness.  Psychiatric/Behavioral: Negative for depression and hallucinations. The patient is not nervous/anxious.    Tolerating Diet:yes Tolerating PT: pending  DRUG ALLERGIES:  No Known Allergies  VITALS:  Blood pressure 125/78, pulse 79, temperature 97.8 F (36.6 C), temperature source Oral, resp. rate 18, height 5\' 3"  (1.6 m), weight (!) 152.1 kg (335 lb 6.4 oz), SpO2 92 %.  PHYSICAL EXAMINATION:   Physical Exam  GENERAL:  57 y.o.-year-old patient lying in the bed with no acute distress. Morbidly obese EYES: Pupils equal, round, reactive to light and accommodation. No scleral icterus. Extraocular muscles intact.  HEENT: Head atraumatic, normocephalic. Oropharynx and nasopharynx clear.  NECK:  Supple, no jugular venous distention. No thyroid enlargement, no tenderness.  LUNGS: Normal breath sounds bilaterally, no wheezing, rales, rhonchi. No use of accessory muscles of respiration.  CARDIOVASCULAR: S1, S2 normal. No murmurs, rubs, or gallops.  ABDOMEN: Soft,  nontender, nondistended. Bowel sounds present. No organomegaly or mass.  EXTREMITIES: No cyanosis, clubbing o +++edema b/l.    NEUROLOGIC: Cranial nerves II through XII are intact. No focal Motor or sensory deficits b/l.   PSYCHIATRIC:  patient is alert and oriented x 3.  SKIN: No obvious rash, lesion, or ulcer.   LABORATORY PANEL:  CBC Recent Labs  Lab 12/27/17 0456  WBC 5.7  HGB 15.7  HCT 51.9*  PLT 231    Chemistries  Recent Labs  Lab 12/27/17 0456  NA 142  K 5.0  CL 95*  CO2 41*  GLUCOSE 123*  BUN 15  CREATININE 1.05*  CALCIUM 8.7*   Cardiac Enzymes Recent Labs  Lab 12/26/17 0918  TROPONINI <0.03   RADIOLOGY:  No results found. ASSESSMENT AND PLAN:   [name] a57 y.o.femalewith a known history of hypertension, chronic smoker comes to the emergency room with increasing shortness of breath leg swelling and feeling bloated all over with weight gain. Reports the symptoms going on for a few weeks however felt poorly with shortness of breath comes to the emergency room found to be in new-onset congestive heart failure  * Ac hypercapneic respi failure   was on bipap--now on [place] oxygen.  * Acute hypoxic respiratory failure secondary to CHF ac diastolic -Came in with increasing shortness of breath, leg swelling and feeling bloated with sats 80% on room air in the emergency room -BNP elevated chest x-ray consistent with bilateral pulmonary vascular congestion -IV Lasix 40mg  3 times daily---patient diuresing well -Blood pressure soft and holding of beta blockers and ACE inhibitor -Aspirin -Cardiology consultation appreciated. -Echo- increased right sided pressure. -Troponin x3, TSH normal -Patient qualifies for home oxygen.  We will place her on  auto CPAP tonight.  * Morbidly obesity with possible underlying sleep apnea -Patient will need to get sleep study as outpatient.  -CPAP tonight  * Hypertension -start BB  * Elevated H&H -Suspect  secondary polycythemia in the setting of smoking -Continue to monitor  * Tobacco abuse smoking cessation advised about 4 minutes spent  * DVT prophylaxis subcu Lovenox  Discussed with patient and family   Case discussed with Care Management/Social Worker. Management plans discussed with the patient, family and they are in agreement.  CODE STATUS: full  DVT Prophylaxis:lovenox   TOTAL TIME TAKING CARE OF THIS PATIENT:  minutes.  >50% time spent on counselling and coordination of care  POSSIBLE D/C IN 2-3 DAYS, DEPENDING ON CLINICAL CONDITION.  Note: This dictation was prepared with Dragon dictation along with smaller phrase technology. Any transcriptional errors that result from this process are unintentional.  Fritzi Mandes M.D on 12/28/2017 at 4:00 PM  Between 7am to 6pm - Pager - [phone]  After 6pm go to [email or web address] - password [email or web address] [facility]  Office  [phone]  CC: Primary care physician; [facility]Patient ID: Christine Oneill, female   DOB: 01-09-1961, 57 y.o.   MRN: [id number]

## 2017-12-28 NOTE — Care Management (Signed)
Patient transferred out of icu.  Discussed plan of care with attending.  Have requested home oxygen assessment to be performed asap so in the event she does not qualify for continuous, will require an overnight oximetry on room air for at least 2 hours of recording time.  Discussed use of cpap overnight.  Patient would be able to discharge on a bipap but would have to pay out of pocket until qualified for the bipap - only then would ins cover.  The sleep study has to have been scheduled.   Can discharge with cpap but again a sleep study has to have been scheduled and patient would have to guarantee payment and or provide credit card

## 2017-12-28 NOTE — Plan of Care (Signed)
Pt is A&Ox4. VSS. 2L O2 [place] continued. Family at bedside. OOB to [facility] with standby assist. NSR on monitor. No complaints thus far. Will continue to monitor and report to oncoming RN .  Progressing Health Behavior/Discharge Planning: Ability to manage health-related needs will improve 12/28/2017 1709 - Progressing by Aleen Campi, RN Clinical Measurements: Ability to maintain clinical measurements within normal limits will improve 12/28/2017 1709 - Progressing by Aleen Campi, RN Will remain free from infection 12/28/2017 1709 - Progressing by Aleen Campi, RN Diagnostic test results will improve 12/28/2017 1709 - Progressing by Aleen Campi, RN Respiratory complications will improve 12/28/2017 1709 - Progressing by Aleen Campi, RN Cardiovascular complication will be avoided 12/28/2017 1709 - Progressing by Aleen Campi, RN Activity: Risk for activity intolerance will decrease 12/28/2017 1709 - Progressing by Aleen Campi, RN Coping: Level of anxiety will decrease 12/28/2017 1709 - Progressing by Aleen Campi, RN Elimination: Will not experience complications related to bowel motility 12/28/2017 1709 - Progressing by Aleen Campi, RN Pain Managment: General experience of comfort will improve 12/28/2017 1709 - Progressing by Aleen Campi, RN Safety: Ability to remain free from injury will improve 12/28/2017 1709 - Progressing by Aleen Campi, RN Skin Integrity: Risk for impaired skin integrity will decrease 12/28/2017 1709 - Progressing by Aleen Campi, RN Education: Ability to demonstrate management of disease process will improve 12/28/2017 1709 - Progressing by Aleen Campi, RN Ability to verbalize understanding of medication therapies will improve 12/28/2017 1709 - Progressing by Aleen Campi, RN Activity: Capacity to carry out activities will improve 12/28/2017 1709 - Progressing by Aleen Campi, RN Cardiac: Ability to achieve and maintain adequate cardiopulmonary  perfusion will improve 12/28/2017 1709 - Progressing by Aleen Campi, RN

## 2017-12-29 LAB — GLUCOSE, CAPILLARY: GLUCOSE-CAPILLARY: 122 mg/dL — AB (ref 65–99)

## 2017-12-29 MED ORDER — ASPIRIN 81 MG PO TBEC: 81.0000 mg | DELAYED_RELEASE_TABLET | Freq: Every day | ORAL | 0 refills | Status: DC

## 2017-12-29 MED ORDER — ALBUTEROL SULFATE HFA 108 (90 BASE) MCG/ACT IN AERS: 2.0000 | INHALATION_SPRAY | Freq: Four times a day (QID) | RESPIRATORY_TRACT | 2 refills | Status: AC | PRN

## 2017-12-29 MED ORDER — METOPROLOL SUCCINATE ER 25 MG PO TB24: 12.5000 mg | ORAL_TABLET | Freq: Every day | ORAL | 0 refills | Status: DC

## 2017-12-29 MED ORDER — FUROSEMIDE 40 MG PO TABS: 40.0000 mg | ORAL_TABLET | Freq: Every day | ORAL | 1 refills | Status: AC

## 2017-12-29 MED ORDER — NICOTINE 14 MG/24HR TD PT24: 14.0000 mg | MEDICATED_PATCH | Freq: Every day | TRANSDERMAL | 0 refills | Status: DC

## 2017-12-29 MED ORDER — ALBUTEROL SULFATE (2.5 MG/3ML) 0.083% IN NEBU: 2.5000 mg | INHALATION_SOLUTION | Freq: Four times a day (QID) | RESPIRATORY_TRACT | 12 refills | Status: AC | PRN

## 2017-12-29 MED ORDER — FUROSEMIDE 40 MG PO TABS
40.0000 mg | ORAL_TABLET | Freq: Every day | ORAL | Status: DC
  Administered 2017-12-29: 40 mg via ORAL
  Filled 2017-12-29: qty 1

## 2017-12-29 MED ORDER — FUROSEMIDE 40 MG PO TABS: 40.0000 mg | ORAL_TABLET | Freq: Once | ORAL | Status: DC

## 2017-12-29 NOTE — Care Management (Signed)
Patient is discharge home today.  [facility] and there is verbal order for new home 02, home nebulizer.  Had anticipated order for home cpap and have reached out to physician for the order.  An out patient sleep study is being scheduled.  Have requested order for home health nurse.  This is the only service that will be needed.

## 2017-12-29 NOTE — Progress Notes (Signed)
IV and tele removed from patient. Discharge instructions given to patient, verbalized understanding. Oxygen was delivered and transported out to car with patient. Family member to transport patient home.

## 2017-12-29 NOTE — Progress Notes (Signed)
Nutrition Education Note  RD consulted for nutrition education regarding new onset CHF.  57 y.o.femalewith a known history of hypertension, chronic smoker comes to the emergency room with increasing shortness of breath leg swelling and feeling bloated all over with weight gain.  RD provided "Low Sodium Nutrition Therapy" handout from the Academy of [employer] and Dietetics. Reviewed patient's dietary recall. Provided examples on ways to decrease sodium intake in diet. Discouraged intake of processed foods and use of salt shaker. Encouraged fresh fruits and vegetables as well as whole grain sources of carbohydrates to maximize fiber intake.   RD discussed why it is important for patient to adhere to diet recommendations, and emphasized the role of fluids, foods to avoid, and importance of weighing self daily. Teach back method used.  Expect fair compliance.  Body mass index is 58.97 kg/m. Pt meets criteria for morbid obesity based on current BMI.  Current diet order is HH, patient is consuming approximately 100% of meals at this time. Labs and medications reviewed. No further nutrition interventions warranted at this time. RD contact information provided. If additional nutrition issues arise, please re-consult RD.   Koleen Distance MS, RD, LDN Pager #- [phone] After Hours Pager: [phone]

## 2017-12-29 NOTE — Progress Notes (Signed)
Patient is a 57 year old female with known history of HTN, chronic smoker (smokes 1 ppd).  Patient with acute hypercapneic respiratory failure requiring BiPAP.    Patient reports she does not have a pulmonologist.  Final Discharge Dx of Acute Diastolic CHF/Cor Pulmonale / Right sided HF -- new onset, hypoxia requiring oxygen, acute on chronic COPD exacerbation with ongoing tobacco abuse, suspected OSA - sleep study scheduled as outpatient, and MO.    CHF Education:?? Educational session with patient completed.  ? Provided patient with "Living Better with Heart Failure" packet. Briefly reviewed definition of heart failure and signs and symptoms of an exacerbation.  Explained to patient that HF is a chronic illness which requires self-assessment / self-management along with help from the cardiologist/PCP.?? ?*Reviewed importance of and reason behind checking weight daily in the AM, after using the bathroom, but before getting dressed. Patient informed RNCM that she has scales.  Patient informed this RN that she does not have scales.  Patient did not answer this RN's question upon asking her if she has the means to buy scales.   ?  Reviewed the following information with patient:  *Discussed when to call the Dr= weight gain of >2-3lb overnight of 5lb in a week,  *Discussed yellow zone= call MD: weight gain of >2-3lb overnight of 5lb in a week, increased swelling, increased SOB when lying down, chest discomfort, dizziness, increased fatigue *Red Zone= call 911: struggle to breath, fainting or near fainting, significant chest pain  ? *Reviewed low sodium diet-provided handout of recommended and not recommended foods.  Reviewed reading labels with patient. Discussed fluid intake with patient as well. Patient not currently on a fluid restriction, but advised no more than 8-8 ounces glass of fluids per day.?Dietitatian has educated patient on diet.    *Instructed patient to take medications as prescribed for  heart failure. Explained briefly why pt is on the medications (either make you feel better, live longer or keep you out of the hospital) and discussed monitoring and side effects.  ? *Discussed exercise.  Patient does not currently exercise.  Explained to patient she qualifies for Pulmonary Rehab with the dx of Pulmonary HTN, CHF, Right Sided Heart Failure, and MO. Patient will be discharged today with [facility] and appointment for Sleep Study for suspected for OSA.  After HH ends their coverage patient will be able to come to Pulmonary Rehab. Referral for outpatient Pulmonary Rehab entered and needs to be co-signed by Dr. Nehemiah Massed.     *Smoking Cessation - Patient is a current smoker. Patient informed this RN that she has quit smoking before when she was pregnant with each of her daughters - twenty some years ago.  Patient given information on smoking cessation, list of apps for smoking cessation and apps for relaxation, as well as the information sheet - "Thinking About Quitting - Yes You can!"  Also, information provided on Quit Smart Smoking Cessation Classes.    Again reviewed the 5 Steps to Living Better with Heart Failure. Patient thanked me for providing the above information. ? ? Libero Puthoff ?

## 2017-12-29 NOTE — Progress Notes (Signed)
Placed pt on Cpap at 2300 on 12/28/17. Pt would only wear nasal mask. Pt wore Cpap all night. Cpap was on auto settings with 2L O2 bleed in.

## 2017-12-29 NOTE — Discharge Summary (Signed)
[facility] NAME: Christine Oneill    MR#:  [id number]  DATE OF BIRTH:  [birth year]  DATE OF ADMISSION:  12/25/2017 ADMITTING PHYSICIAN: Fritzi Mandes, MD  DATE OF DISCHARGE: 12/29/17  PRIMARY CARE PHYSICIAN: [facility] [place]    ADMISSION DIAGNOSIS:  Cellulitis of left breast [N61.0] Acute congestive heart failure, unspecified heart failure type ([place]) [I50.9]  DISCHARGE DIAGNOSIS:  Acute Diastolic CHF/Cor pulmonale/Right sided HF--new onset Hypoxia requiring oxygen Acute on CHronic COPD flare with h/o  tobacco abuse Suspected OSA--Sleep study as out pt Morbid obesity  SECONDARY DIAGNOSIS:   Past Medical History:  Diagnosis Date  . Hypertension     HOSPITAL COURSE:  [name] a57 y.o.femalewith a known history of hypertension, chronic smoker comes to the emergency room with increasing shortness of breath leg swelling and feeling bloated all over with weight gain. Reports the symptoms going on for a few weeks however felt poorly with shortness of breath comes to the emergency room found to be in new-onset congestive heart failure  * Ac hypercapneic respi failure with known  h/o COPD was on bipap--now on [facility] oxygen.  *Acute hypoxic respiratory failure secondary to CHFdiastolic with underlying COPD/Cor Pulmonale -Came in with increasing shortness of breath, leg swelling and feeling bloated with sats 80% on room air in the emergency room -BNP elevated chest x-ray consistent with bilateral pulmonary vascular congestion -IV Lasix 40mg 3 times daily---patient diuresing well---good uop and decreasing weight--lasix 40 mg qd -Blood pressure soft and holding  ACE inhibitor -low dose BB with holding parameter -Aspirin -Cardiology consultationappreciated. -Echo- increased right sided pressure. -Troponin x3, TSH normal -Patient qualifies for home oxygen.  We will place her on auto CPAP and out pt   Sleep study to be scheduled  *Morbidly obesity with possible underlying sleep apnea -Patient will need to get sleep study as outpatient.  - tolerate auto CPAP  *h/o Hypertension -start low dose BB  *Elevated H&H -Suspect secondary polycythemia in the setting of smoking  *Tobacco abuse smoking cessation advised about 4 minutes spent  *DVT prophylaxis subcu Lovenox  D/c home with HHPT/RN  CONSULTS OBTAINED:  Treatment Team:  Corey Skains, MD  DRUG ALLERGIES:  No Known Allergies  DISCHARGE MEDICATIONS:   Allergies as of 12/29/2017   No Known Allergies     Medication List    STOP taking these medications   hydrochlorothiazide 12.5 MG tablet Commonly known as:  HYDRODIURIL   ibuprofen 600 MG tablet Commonly known as:  ADVIL,MOTRIN     TAKE these medications   albuterol (2.5 MG/3ML) 0.083% nebulizer solution Commonly known as:  PROVENTIL Take 3 mLs (2.5 mg total) by nebulization every 6 (six) hours as needed for wheezing or shortness of breath.   albuterol 108 (90 Base) MCG/ACT inhaler Commonly known as:  PROVENTIL HFA;VENTOLIN HFA Inhale 2 puffs into the lungs every 6 (six) hours as needed for wheezing or shortness of breath.   aspirin 81 MG EC tablet Take 1 tablet (81 mg total) by mouth daily. Start taking on:  12/30/2017   furosemide 40 MG tablet Commonly known as:  LASIX Take 1 tablet (40 mg total) by mouth daily.   metoprolol succinate 25 MG 24 hr tablet Commonly known as:  TOPROL-XL Take 0.5 tablets (12.5 mg total) by mouth daily. Hold if SBP<105 Start taking on:  12/30/2017   nicotine 14 mg/24hr patch Commonly known as:  NICODERM CQ - dosed in mg/24 hours Place 1  patch (14 mg total) onto the skin daily.            Durable Medical Equipment  (From admission, onward)        Start     Ordered   12/29/17 1242  For home use only DME Nebulizer machine  Once    Question:  Patient needs a nebulizer to treat with the following  condition  Answer:  COPD (chronic obstructive pulmonary disease) ([place])   12/29/17 1244      If you experience worsening of your admission symptoms, develop shortness of breath, life threatening emergency, suicidal or homicidal thoughts you must seek medical attention immediately by calling 911 or calling your MD immediately  if symptoms less severe.  You Must read complete instructions/literature along with all the possible adverse reactions/side effects for all the Medicines you take and that have been prescribed to you. Take any new Medicines after you have completely understood and accept all the possible adverse reactions/side effects.   Please note  You were cared for by a hospitalist during your hospital stay. If you have any questions about your discharge medications or the care you received while you were in the hospital after you are discharged, you can call the unit and asked to speak with the hospitalist on call if the hospitalist that took care of you is not available. Once you are discharged, your primary care physician will handle any further medical issues. Please note that NO REFILLS for any discharge medications will be authorized once you are discharged, as it is imperative that you return to your primary care physician (or establish a relationship with a primary care physician if you do not have one) for your aftercare needs so that they can reassess your need for medications and monitor your lab values. Today   SUBJECTIVE   Doing well Out in the chair  VITAL SIGNS:  Blood pressure 103/62, pulse 82, temperature 98.5 F (36.9 C), temperature source Oral, resp. rate 18, height 5\' 3"  (1.6 m), weight (!) 151 kg (332 lb 14.4 oz), SpO2 90 %.  I/O:    Intake/Output Summary (Last 24 hours) at 12/29/2017 1246 Last data filed at 12/29/2017 0958 Gross per 24 hour  Intake 720 ml  Output 3900 ml  Net -3180 ml    PHYSICAL EXAMINATION:  GENERAL:  57 y.o.-year-old patient lying in  the bed with no acute distress. obese EYES: Pupils equal, round, reactive to light and accommodation. No scleral icterus. Extraocular muscles intact.  HEENT: Head atraumatic, normocephalic. Oropharynx and nasopharynx clear.  NECK:  Supple, no jugular venous distention. No thyroid enlargement, no tenderness.  LUNGS: Normal breath sounds bilaterally, no wheezing, rales,rhonchi or crepitation. No use of accessory muscles of respiration.  CARDIOVASCULAR: S1, S2 normal. No murmurs, rubs, or gallops.  ABDOMEN: Soft, non-tender, non-distended. Bowel sounds present. No organomegaly or mass.  EXTREMITIES: No pedal edema, cyanosis, or clubbing.  NEUROLOGIC: Cranial nerves II through XII are intact. Muscle strength 5/5 in all extremities. Sensation intact. Gait not checked.  PSYCHIATRIC: The patient is alert and oriented x 3.  SKIN: No obvious rash, lesion, or ulcer.   DATA REVIEW:   CBC  Recent Labs  Lab 12/27/17 0456  WBC 5.7  HGB 15.7  HCT 51.9*  PLT 231    Chemistries  Recent Labs  Lab 12/27/17 0456  NA 142  K 5.0  CL 95*  CO2 41*  GLUCOSE 123*  BUN 15  CREATININE 1.05*  CALCIUM 8.7*  Microbiology Results   Recent Results (from the past 240 hour(s))  MRSA PCR Screening     Status: None   Collection Time: 12/26/17  4:09 PM  Result Value Ref Range Status   MRSA by PCR NEGATIVE NEGATIVE Final    Comment:        The GeneXpert MRSA Assay (FDA approved for NASAL specimens only), is one component of a comprehensive MRSA colonization surveillance program. It is not intended to diagnose MRSA infection nor to guide or monitor treatment for MRSA infections. Performed at [facility], [address], [place], [place] [address]     RADIOLOGY:  No results found.   Management plans discussed with the patient, family and they are in agreement.  CODE STATUS:     Code Status Orders  (From admission, onward)        Start     Ordered   12/25/17 2055  Full  code  Continuous     12/25/17 2054    Code Status History    Date Active Date Inactive Code Status Order ID Comments User Context   This patient has a current code status but no historical code status.      TOTAL TIME TAKING CARE OF THIS PATIENT: 40 minutes.    Fritzi Mandes M.D on 12/29/2017 at 12:46 PM  Between 7am to 6pm - Pager - [phone] After 6pm go to [email or web address] - password [email or web address] [facility]  Office  [phone]  CC: Primary care physician; [facility] [place]

## 2017-12-29 NOTE — Clinical Social Work Note (Signed)
Patient requested to speak with CSW regarding disability application process, CSW advised her that she would have to contact DSS and social security office.  Patient expressed that she needs to find a new place to live, CSW told her she will have to speak to DSS about that as well.  Patient plans to discharge back to her sister's apartment.  Case manager has assisted with setting up home health for patient, CSW to sign off please reconsult if other social work needs arise.  Jones Broom. Norval Morton, MSW, [place]  12/29/2017 4:46 PM

## 2017-12-29 NOTE — Progress Notes (Signed)
SATURATION QUALIFICATIONS: (This note is used to comply with regulatory documentation for home oxygen)  Patient Saturations on Room Air at Rest = 88%  Patient Saturations on Room Air while Ambulating = n/a%  Patient Saturations on 3 Liters of oxygen while Ambulating = 93%  Please briefly explain why patient needs home oxygen:  desats on room air with no exertion.

## 2017-12-29 NOTE — Care Management (Addendum)
CM informed that patient has decided to get a cpap machine "from a friend that has a brand new one."  CM spoke with patient and relayed that if chooses to get the cpap machine from her friend that [facility] will not be able to instruct on use, provide supplies or enter settings.  Patient then says she does not have the 92 dollars to pay for the machine until she qualifies with her sleep study.  Patient nor family verbalized these concerns when this was discussed on previous occasion.  Contacted the [facility] and obtained guarantee of payment to [facility].  The referral has been faxed to sleep med.  Agency has to obtain insurance approval and then schedule.  CM faxed requested clinical information.  CM provided scales.  Patient had relayed on previous occassion that she had scales but it seem that patient may be holding back some information on needs

## 2017-12-30 LAB — BLOOD GAS, ARTERIAL
Acid-Base Excess: 8.5 mmol/L — ABNORMAL HIGH (ref 0.0–2.0)
BICARBONATE: 42.2 mmol/L — AB (ref 20.0–28.0)
FIO2: 0.28
O2 SAT: 70.8 %
PO2 ART: 46 mmHg — AB (ref 83.0–108.0)
Patient temperature: 37
pCO2 arterial: 108 mmHg (ref 32.0–48.0)
pH, Arterial: 7.2 — ABNORMAL LOW (ref 7.350–7.450)

## 2018-01-02 NOTE — Progress Notes (Signed)
Patient ID: Christine Oneill, female    DOB: 1961/07/31, 57 y.o.   MRN: [id number]  HPI  Christine Oneill is a 57 y/o female with a history of pulmonary HTN, obstructive sleep apnea, HTN, previous tobacco use and chronic heart failure.   Echo report from 12/26/17 reviewed and showed an EF of 60-65% along with trivial AR and moderate TR.   Admitted 12/25/17 due to new onset HF. Cardiology consult obtained. Initially needed IV diuretics along with bipap. Transitioned to oral diuretics. Recommend outpatient sleep study. Discharged home with home health after 4 days.  She presents today for her initial visit with a chief complaint of minimal shortness of breath upon moderate exertion. She describes this as being present for several months although does feel like it's been improving. She has an associated cough along with lower extremity edema. She denies any fatigue, chest pain, palpitations, dizziness, abdominal distention, weight gain or difficulty sleeping.   Past Medical History:  Diagnosis Date  . CHF (congestive heart failure) ([place])   . Hypertension   . Obstructive sleep apnea   . Pulmonary HTN ([place])     Past Surgical History:  Procedure Laterality Date  . APPENDECTOMY    . CESAREAN SECTION     No family history on file. Social History   Tobacco Use  . Smoking status: Former Smoker    Packs/day: 1.00    Types: Cigarettes  . Smokeless tobacco: Never Used  Substance Use Topics  . Alcohol use: No   No Known Allergies Prior to Admission medications   Medication Sig Start Date End Date Taking? Authorizing Provider  albuterol (PROVENTIL HFA;VENTOLIN HFA) 108 (90 Base) MCG/ACT inhaler Inhale 2 puffs into the lungs every 6 (six) hours as needed for wheezing or shortness of breath. 12/29/17  Yes Fritzi Mandes, MD  albuterol (PROVENTIL) (2.5 MG/3ML) 0.083% nebulizer solution Take 3 mLs (2.5 mg total) by nebulization every 6 (six) hours as needed for wheezing or shortness of breath. 12/29/17  Yes Fritzi Mandes, MD  aspirin EC 81 MG EC tablet Take 1 tablet (81 mg total) by mouth daily. 12/30/17  Yes Fritzi Mandes, MD  furosemide (LASIX) 40 MG tablet Take 1 tablet (40 mg total) by mouth daily. 12/29/17  Yes Fritzi Mandes, MD  metoprolol succinate (TOPROL-XL) 25 MG 24 hr tablet Take 0.5 tablets (12.5 mg total) by mouth daily. Hold if SBP<105 12/30/17  Yes Fritzi Mandes, MD    Review of Systems  Constitutional: Negative for appetite change and fatigue.  HENT: Negative for congestion, postnasal drip and sore throat.   Eyes: Negative.   Respiratory: Positive for cough (productive) and shortness of breath. Negative for chest tightness.   Cardiovascular: Positive for leg swelling. Negative for chest pain and palpitations.  Gastrointestinal: Negative for abdominal distention and abdominal pain.  Endocrine: Negative.   Genitourinary: Negative.   Musculoskeletal: Negative for back pain and neck pain.  Skin: Negative.   Allergic/Immunologic: Negative.   Neurological: Negative for dizziness and light-headedness.  Hematological: Negative for adenopathy. Does not bruise/bleed easily.  Psychiatric/Behavioral: Negative for dysphoric mood and sleep disturbance (wearing oxygen on CPAP). The patient is not nervous/anxious.    Vitals:   01/04/18 0844  BP: (!) 142/75  Pulse: 78  Resp: 18  SpO2: 97%  Weight: (!) 329 lb 8 oz (149.5 kg)  Height: 5\' 3"  (1.6 m)   Wt Readings from Last 3 Encounters:  01/04/18 (!) 329 lb 8 oz (149.5 kg)  12/29/17 (!) 332 lb 14.4 oz (  151 kg)  05/29/17 (!) 325 lb (147.4 kg)   Lab Results  Component Value Date   CREATININE 1.05 (H) 12/27/2017   CREATININE 1.10 (H) 12/26/2017   CREATININE 1.06 (H) 12/25/2017   Physical Exam  Constitutional: She is oriented to person, place, and time. She appears well-developed and well-nourished.  HENT:  Head: Normocephalic and atraumatic.  Neck: Normal range of motion. Neck supple. No JVD present.  Cardiovascular: Normal rate and regular  rhythm.  Pulmonary/Chest: Effort normal. She has no wheezes. She has no rales.  Abdominal: Soft. She exhibits no distension. There is no tenderness.  Musculoskeletal: She exhibits edema (2+ pitting edema). She exhibits no tenderness.  Neurological: She is alert and oriented to person, place, and time.  Skin: Skin is warm and dry.  Psychiatric: She has a normal mood and affect. Her behavior is normal. Thought content normal.  Nursing note and vitals reviewed.  Assessment & Plan:  1: Chronic heart failure with preserved ejection fraction- - NYHA class II - mildly fluid overloaded today - weighing daily and she has noticed a gradual weight loss. Instructed to call for an overnight weight gain of >2 pounds or a weekly weight gain of >5 pounds - weight down 3 pounds from hospital discharge - not adding salt and has been reading food labels for sodium content. Instructed to keep daily sodium intake to 2000mg  a day and written dietary information was given to her about this - drinking ~ 6 sixteen ounce bottles of water daily (96 ounces). Discussed decreasing fluid consumption to closer to 60 ounces daily - BNP on 12/25/17 was 266.0 - saw cardiology Nehemiah Massed) 06/05/17 & returns later today - she doesn't think she's gotten the flu vaccine  2: HTN- - BP looks good today - saw PCP Princella Ion) last week - BMP on 12/27/17 reviewed and showed sodium 142, potassium 5.0 and GFR >60  3: Obstructive sleep apnea- - wearing CPAP nightly & says that she's adjusting to wearing it - wearing oxygen at 2L around the clock - sees pulmonologist Ashby Dawes) later today - discussed pulmonary rehab referral and she was instructed to speak with pulmonologist today about this - no longer smoking  4: Lymphedema- - stage 2 - not wearing compression socks and she was instructed to get a pair and wear them daily with removal at bedtime - not elevating her legs and she was encouraged to elevate them on the couch  or ottoman as she doesn't have a recliner - trying to walk some - should edema persist, could consider lymphapress compression boots  Patient did not bring her medications nor a list. Each medication was verbally reviewed with the patient and she was encouraged to bring the bottles to every visit to confirm accuracy of list.  Return in 6 weeks or sooner for any questions/problems before then

## 2018-01-03 NOTE — Progress Notes (Signed)
[facility] Consultation      Assessment and Plan:  Suspected obstructive sleep apnea with with obesity hypoventilation syndrome with morbid obesity. - Patient has sleep study scheduled on 1/22, start on CPAP or BiPAP based on those recommendations.  Chronic hypoxic respiratory failure. -Oxygen saturation is 89-90% at rest, she is advised that she should continue using oxygen at 2 L with ambulation particularly when leaving the home, and at night.  Is less important when she is sitting.  Pulmonary hypertension. 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 -Continue management of underlying conditions.   Date: 01/04/2018  MRN# [id number] Taeja Debellis 26-Dec-1960   Christine Oneill is a 57 y.o. old female seen in consultation for chief complaint of:    Chief Complaint  Patient presents with  . Hospitalization Follow-up    Pt on 2L 02 therefore she is not having any sob.  . Cough    tan, thick mucus.    HPI:   The patient is a 57 year old female, she was recently admitted to the hospital for acute respiratory failure due to acute diastolic congestive heart failure/cor pulmonale.  She was suspected of having obstructive sleep apnea.  On a recent admission CO2 levels were elevated in the 37-41 range.  Since getting out of the hospital her breathing is better than it was. She takes albuterol prn, but rarely. She is on 2L oxygen at all times. She has a PAP machine at home after the hospitalization, she uses it every night and is getting used to it.  She has a sleep study scheduled 1/22.    Imaging personally reviewed, chest x-ray 12/25/17; findings of chronic bronchitis, pulmonary edema cardiomegaly.   PMHX:   Past Medical History:  Diagnosis Date  . CHF (congestive heart failure) ([place])   . Hypertension   . Obstructive sleep apnea   . Pulmonary HTN ([place])    Surgical Hx:  Past Surgical History:  Procedure Laterality Date  . APPENDECTOMY     . CESAREAN SECTION     Family Hx:  History reviewed. No pertinent family history. Social Hx:   Social History   Tobacco Use  . Smoking status: Former Smoker    Packs/day: 1.00    Types: Cigarettes  . Smokeless tobacco: Never Used  Substance Use Topics  . Alcohol use: No  . Drug use: No   Medication:    Current Outpatient Medications:  .  albuterol (PROVENTIL HFA;VENTOLIN HFA) 108 (90 Base) MCG/ACT inhaler, Inhale 2 puffs into the lungs every 6 (six) hours as needed for wheezing or shortness of breath., Disp: 1 Inhaler, Rfl: 2 .  albuterol (PROVENTIL) (2.5 MG/3ML) 0.083% nebulizer solution, Take 3 mLs (2.5 mg total) by nebulization every 6 (six) hours as needed for wheezing or shortness of breath., Disp: 75 mL, Rfl: 12 .  aspirin EC 81 MG EC tablet, Take 1 tablet (81 mg total) by mouth daily., Disp: 30 tablet, Rfl: 0 .  furosemide (LASIX) 40 MG tablet, Take 1 tablet (40 mg total) by mouth daily., Disp: 30 tablet, Rfl: 1 .  metoprolol succinate (TOPROL-XL) 25 MG 24 hr tablet, Take 0.5 tablets (12.5 mg total) by mouth daily. Hold if SBP<105, Disp: 30 tablet, Rfl: 0   Allergies:  Patient has no known allergies.  Review of Systems: Gen:  Denies  fever, sweats, chills HEENT: Denies blurred vision, double vision. bleeds, sore throat Cvc:  No dizziness, chest pain. Resp:   Denies cough or sputum production, shortness of breath  Gi: Denies swallowing difficulty, stomach pain. Gu:  Denies bladder incontinence, burning urine Ext:   No Joint pain, stiffness. Skin: No skin rash,  hives  Endoc:  No polyuria, polydipsia. Psych: No depression, insomnia. Other:  All other systems were reviewed with the patient and were negative other that what is mentioned in the HPI.   Physical Examination:   VS: BP 118/82 (BP Location: Left Arm, Cuff Size: Large)   Pulse 84   Resp 16   Ht 5\' 3"  (1.6 m)   Wt (!) 329 lb (149.2 kg)   SpO2 94%   BMI 58.28 kg/m   General Appearance: No distress    Neuro:without focal findings,  speech normal,  HEENT: PERRLA, EOM intact.   Pulmonary: normal breath sounds, No wheezing.  CardiovascularNormal S1,S2.  No m/r/g.   Abdomen: Benign, Soft, non-tender. Renal:  No costovertebral tenderness  GU:  No performed at this time. Endoc: No evident thyromegaly, no signs of acromegaly. Skin:   warm, no rashes, no ecchymosis  Extremities: normal, no cyanosis, clubbing.  Other findings:    LABORATORY PANEL:   CBC No results for input(s): WBC, HGB, HCT, PLT in the last 168 hours. ------------------------------------------------------------------------------------------------------------------  Chemistries  No results for input(s): NA, K, CL, CO2, GLUCOSE, BUN, CREATININE, CALCIUM, MG, AST, ALT, ALKPHOS, BILITOT in the last 168 hours.  Invalid input(s): GFRCGP ------------------------------------------------------------------------------------------------------------------  Cardiac Enzymes No results for input(s): TROPONINI in the last 168 hours. ------------------------------------------------------------  RADIOLOGY:  No results found.     Thank  you for the consultation and for allowing [facility] to assist in the care of your patient. Our recommendations are noted above.  Please contact us if we can be of further service.   Marda Stalker, MD.  Board Certified in Internal Medicine, Pulmonary Medicine, [place], and Sleep Medicine.  [facility] Office Number: [phone]  Patricia Pesa, M.D.  Merton Border, M.D  01/04/2018

## 2018-01-04 ENCOUNTER — Ambulatory Visit: Payer: BLUE CROSS/BLUE SHIELD | Admitting: Internal Medicine

## 2018-01-04 ENCOUNTER — Ambulatory Visit: Payer: BLUE CROSS/BLUE SHIELD | Attending: Family | Admitting: Family

## 2018-01-04 ENCOUNTER — Encounter: Payer: Self-pay | Admitting: Family

## 2018-01-04 ENCOUNTER — Encounter: Payer: Self-pay | Admitting: Internal Medicine

## 2018-01-04 VITALS — BP 142/75 | HR 78 | Resp 18 | Ht 63.0 in | Wt 329.5 lb

## 2018-01-04 VITALS — BP 118/82 | HR 84 | Resp 16 | Ht 63.0 in | Wt 329.0 lb

## 2018-01-04 DIAGNOSIS — I5032 Chronic diastolic (congestive) heart failure: Secondary | ICD-10-CM | POA: Diagnosis not present

## 2018-01-04 DIAGNOSIS — Z7982 Long term (current) use of aspirin: Secondary | ICD-10-CM | POA: Insufficient documentation

## 2018-01-04 DIAGNOSIS — I272 Pulmonary hypertension, unspecified: Secondary | ICD-10-CM | POA: Diagnosis not present

## 2018-01-04 DIAGNOSIS — I890 Lymphedema, not elsewhere classified: Secondary | ICD-10-CM | POA: Insufficient documentation

## 2018-01-04 DIAGNOSIS — R0602 Shortness of breath: Secondary | ICD-10-CM | POA: Diagnosis present

## 2018-01-04 DIAGNOSIS — G4733 Obstructive sleep apnea (adult) (pediatric): Secondary | ICD-10-CM | POA: Insufficient documentation

## 2018-01-04 DIAGNOSIS — Z79899 Other long term (current) drug therapy: Secondary | ICD-10-CM | POA: Diagnosis not present

## 2018-01-04 DIAGNOSIS — E662 Morbid (severe) obesity with alveolar hypoventilation: Secondary | ICD-10-CM

## 2018-01-04 DIAGNOSIS — I110 Hypertensive heart disease with heart failure: Secondary | ICD-10-CM | POA: Insufficient documentation

## 2018-01-04 DIAGNOSIS — Z87891 Personal history of nicotine dependence: Secondary | ICD-10-CM | POA: Insufficient documentation

## 2018-01-04 DIAGNOSIS — I10 Essential (primary) hypertension: Secondary | ICD-10-CM

## 2018-01-04 NOTE — Patient Instructions (Signed)
Continue weighing daily and call for an overnight weight gain of > 2 pounds or a weekly weight gain of >5 pounds. 

## 2018-01-04 NOTE — Patient Instructions (Signed)
Continue oxygen at home.  Continue using CPAP every night.

## 2018-02-13 NOTE — Progress Notes (Signed)
Patient ID: Christine Oneill, female    DOB: Jun 29, 1961, 57 y.o.   MRN: [id number]  HPI  Ms Nissen is a 57 y/o female with a history of pulmonary HTN, obstructive sleep apnea, HTN, previous tobacco use and chronic heart failure.   Echo report from 12/26/17 reviewed and showed an EF of 60-65% along with trivial AR and moderate TR.   Admitted 12/25/17 due to new onset HF. Cardiology consult obtained. Initially needed IV diuretics along with bipap. Transitioned to oral diuretics. Recommend outpatient sleep study. Discharged home with home health after 4 days.  She presents today for a follow-up visit with a chief complaint of minimal shortness of breath upon moderate exertion. She describes this as chronic in nature having been present for several years. She does feel like her shortness of breath has improved. She has associated fatigue, light-headedness and left sciatica leg pain along with this. She denies any chest pain, edema, palpitations, abdominal distention, difficulty sleeping or weight gain. She would like to return to work if able. 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   Past Medical History:  Diagnosis Date  . CHF (congestive heart failure) ([place])   . Hypertension   . Obstructive sleep apnea   . Pulmonary HTN ([place])     Past Surgical History:  Procedure Laterality Date  . APPENDECTOMY    . CESAREAN SECTION     No family history on file. Social History   Tobacco Use  . Smoking status: Former Smoker    Packs/day: 1.00    Types: Cigarettes  . Smokeless tobacco: Never Used  Substance Use Topics  . Alcohol use: No   No Known Allergies  Prior to Admission medications   Medication Sig Start Date End Date Taking? Authorizing Provider  albuterol (PROVENTIL HFA;VENTOLIN HFA) 108 (90 Base) MCG/ACT inhaler Inhale 2 puffs into the lungs every 6 (six) hours as needed for wheezing or shortness of breath. 12/29/17  Yes Fritzi Mandes, MD  albuterol (PROVENTIL) (2.5 MG/3ML) 0.083% nebulizer solution Take  3 mLs (2.5 mg total) by nebulization every 6 (six) hours as needed for wheezing or shortness of breath. 12/29/17  Yes Fritzi Mandes, MD  aspirin EC 81 MG EC tablet Take 1 tablet (81 mg total) by mouth daily. 12/30/17  Yes Fritzi Mandes, MD  furosemide (LASIX) 40 MG tablet Take 1 tablet (40 mg total) by mouth daily. 12/29/17  Yes Fritzi Mandes, MD  metoprolol succinate (TOPROL-XL) 25 MG 24 hr tablet Take 0.5 tablets (12.5 mg total) by mouth daily. Hold if SBP<105 12/30/17  Yes Fritzi Mandes, MD    Review of Systems  Constitutional: Positive for fatigue. Negative for appetite change.  HENT: Negative for congestion, postnasal drip and sore throat.   Eyes: Negative.   Respiratory: Positive for shortness of breath. Negative for cough and chest tightness.   Cardiovascular: Negative for chest pain, palpitations and leg swelling.  Gastrointestinal: Negative for abdominal distention and abdominal pain.  Endocrine: Negative.   Genitourinary: Negative.   Musculoskeletal: Positive for arthralgias (left upper leg burning). Negative for back pain and neck pain.  Skin: Negative.   Allergic/Immunologic: Negative.   Neurological: Positive for light-headedness (with sudden position changes). Negative for dizziness.  Hematological: Negative for adenopathy. Does not bruise/bleed easily.  Psychiatric/Behavioral: Negative for dysphoric mood and sleep disturbance (wearing oxygen on CPAP). The patient is not nervous/anxious.    Vitals:   02/15/18 0834  BP: 114/75  Pulse: 85  Resp: 18  SpO2: 97%  Weight: (!) 311  lb 8 oz (141.3 kg)  Height: 5\' 3"  (1.6 m)   Wt Readings from Last 3 Encounters:  02/15/18 (!) 311 lb 8 oz (141.3 kg)  01/04/18 (!) 329 lb (149.2 kg)  01/04/18 (!) 329 lb 8 oz (149.5 kg)   Lab Results  Component Value Date   CREATININE 1.05 (H) 12/27/2017   CREATININE 1.10 (H) 12/26/2017   CREATININE 1.06 (H) 12/25/2017    Physical Exam  Constitutional: She is oriented to person, place, and time. She  appears well-developed and well-nourished.  HENT:  Head: Normocephalic and atraumatic.  Neck: Normal range of motion. Neck supple. No JVD present.  Cardiovascular: Normal rate and regular rhythm.  Pulmonary/Chest: Effort normal. She has no wheezes. She has no rales.  Abdominal: Soft. She exhibits no distension. There is no tenderness.  Musculoskeletal: She exhibits edema (trace pitting edema around ankles). She exhibits no tenderness.  Neurological: She is alert and oriented to person, place, and time.  Skin: Skin is warm and dry.  Psychiatric: She has a normal mood and affect. Her behavior is normal. Thought content normal.  Nursing note and vitals reviewed.  Assessment & Plan:  1: Chronic heart failure with preserved ejection fraction- - NYHA class II - euvolemic today - weighing daily and she has noticed a continual weight loss. Reminded to call for an overnight weight gain of >2 pounds or a weekly weight gain of >5 pounds - weight down 18 pounds since 01/04/18 - not adding salt and has been reading food labels for sodium content. Reminded to keep daily sodium intake to 2000mg . - has been exercising more and continuing to work on her diet - has been trying to keep daily fluid intake to closer to 60 ounces - BNP on 12/25/17 was 266.0 - saw cardiology Nehemiah Massed) 06/05/17 & returns 03/02/18 - work note given to her with restrictions of working 4 hours daily and has to be able to wear her oxygen. If daycare doesn't allow oxygen, she will follow-up with Dr. Ashby Dawes   2: HTN- - BP looks good today - sees PCP at [facility])  - BMP on 12/27/17 reviewed and showed sodium 142, potassium 5.0 and GFR >60  3: Obstructive sleep apnea- - wearing CPAP nightly  - wearing oxygen at 2L upon exertion, bedtime and PRN at rest - saw pulmonologist Ashby Dawes) 01/04/18 & returns 03/02/18 - no longer smoking  4: Lymphedema- - stage 2 - elevating her legs more often with improvement of edema -  should edema return, could consider lymphapress compression boots at that time  Patient did not bring her medications nor a list. Each medication was verbally reviewed with the patient and she was encouraged to bring the bottles to every visit to confirm accuracy of list.  Return in 3 months or sooner for any questions/problems before then.

## 2018-02-15 ENCOUNTER — Ambulatory Visit: Payer: BLUE CROSS/BLUE SHIELD | Attending: Family | Admitting: Family

## 2018-02-15 ENCOUNTER — Encounter: Payer: Self-pay | Admitting: Family

## 2018-02-15 VITALS — BP 114/75 | HR 85 | Resp 18 | Ht 63.0 in | Wt 311.5 lb

## 2018-02-15 DIAGNOSIS — R42 Dizziness and giddiness: Secondary | ICD-10-CM | POA: Diagnosis not present

## 2018-02-15 DIAGNOSIS — I890 Lymphedema, not elsewhere classified: Secondary | ICD-10-CM | POA: Diagnosis not present

## 2018-02-15 DIAGNOSIS — I10 Essential (primary) hypertension: Secondary | ICD-10-CM

## 2018-02-15 DIAGNOSIS — Z79899 Other long term (current) drug therapy: Secondary | ICD-10-CM | POA: Diagnosis not present

## 2018-02-15 DIAGNOSIS — G4733 Obstructive sleep apnea (adult) (pediatric): Secondary | ICD-10-CM | POA: Insufficient documentation

## 2018-02-15 DIAGNOSIS — I5032 Chronic diastolic (congestive) heart failure: Secondary | ICD-10-CM | POA: Insufficient documentation

## 2018-02-15 DIAGNOSIS — I110 Hypertensive heart disease with heart failure: Secondary | ICD-10-CM | POA: Insufficient documentation

## 2018-02-15 DIAGNOSIS — I272 Pulmonary hypertension, unspecified: Secondary | ICD-10-CM | POA: Insufficient documentation

## 2018-02-15 DIAGNOSIS — Z7982 Long term (current) use of aspirin: Secondary | ICD-10-CM | POA: Insufficient documentation

## 2018-02-15 DIAGNOSIS — Z87891 Personal history of nicotine dependence: Secondary | ICD-10-CM | POA: Insufficient documentation

## 2018-02-15 DIAGNOSIS — M5432 Sciatica, left side: Secondary | ICD-10-CM | POA: Diagnosis not present

## 2018-02-15 NOTE — Patient Instructions (Signed)
Continue weighing daily and call for an overnight weight gain of > 2 pounds or a weekly weight gain of >5 pounds. 

## 2018-03-02 ENCOUNTER — Ambulatory Visit: Payer: BLUE CROSS/BLUE SHIELD | Admitting: Internal Medicine

## 2018-03-18 ENCOUNTER — Emergency Department
Admission: EM | Admit: 2018-03-18 | Discharge: 2018-03-18 | Disposition: A | Discharge status: Home / Self Care | Payer: BLUE CROSS/BLUE SHIELD | Attending: Emergency Medicine | Admitting: Emergency Medicine

## 2018-03-18 ENCOUNTER — Emergency Department: Payer: BLUE CROSS/BLUE SHIELD

## 2018-03-18 ENCOUNTER — Encounter: Payer: Self-pay | Admitting: Emergency Medicine

## 2018-03-18 ENCOUNTER — Other Ambulatory Visit: Payer: Self-pay

## 2018-03-18 DIAGNOSIS — R609 Edema, unspecified: Secondary | ICD-10-CM

## 2018-03-18 DIAGNOSIS — Z87891 Personal history of nicotine dependence: Secondary | ICD-10-CM | POA: Insufficient documentation

## 2018-03-18 DIAGNOSIS — Z79899 Other long term (current) drug therapy: Secondary | ICD-10-CM | POA: Diagnosis not present

## 2018-03-18 DIAGNOSIS — I110 Hypertensive heart disease with heart failure: Secondary | ICD-10-CM | POA: Diagnosis not present

## 2018-03-18 DIAGNOSIS — I509 Heart failure, unspecified: Secondary | ICD-10-CM | POA: Insufficient documentation

## 2018-03-18 DIAGNOSIS — Z7982 Long term (current) use of aspirin: Secondary | ICD-10-CM | POA: Diagnosis not present

## 2018-03-18 HISTORY — DX: Abnormal uterine and vaginal bleeding, unspecified: N93.9

## 2018-03-18 LAB — CBC
HCT: 41.2 % (ref 35.0–47.0)
HEMOGLOBIN: 13.1 g/dL (ref 12.0–16.0)
MCH: 29.3 pg (ref 26.0–34.0)
MCHC: 31.9 g/dL — ABNORMAL LOW (ref 32.0–36.0)
MCV: 91.7 fL (ref 80.0–100.0)
Platelets: 283 10*3/uL (ref 150–440)
RBC: 4.49 MIL/uL (ref 3.80–5.20)
RDW: 18.1 % — ABNORMAL HIGH (ref 11.5–14.5)
WBC: 6.6 10*3/uL (ref 3.6–11.0)

## 2018-03-18 LAB — BASIC METABOLIC PANEL
ANION GAP: 9 (ref 5–15)
BUN: 19 mg/dL (ref 6–20)
CALCIUM: 9.1 mg/dL (ref 8.9–10.3)
CO2: 23 mmol/L (ref 22–32)
CREATININE: 0.82 mg/dL (ref 0.44–1.00)
Chloride: 106 mmol/L (ref 101–111)
GFR calc non Af Amer: >60 mL/min (ref >60)
Glucose, Bld: 107 mg/dL — ABNORMAL HIGH (ref 65–99)
Potassium: 4.1 mmol/L (ref 3.5–5.1)
SODIUM: 138 mmol/L (ref 135–145)

## 2018-03-18 LAB — BRAIN NATRIURETIC PEPTIDE: B NATRIURETIC PEPTIDE 5: 9 pg/mL (ref 0.0–100.0)

## 2018-03-18 LAB — TROPONIN I

## 2018-03-18 MED ORDER — FUROSEMIDE 10 MG/ML IJ SOLN
40.0000 mg | Freq: Once | INTRAMUSCULAR | Status: AC
  Administered 2018-03-18: 40 mg via INTRAVENOUS
  Filled 2018-03-18: qty 4

## 2018-03-18 NOTE — ED Notes (Signed)
E-signature pad not functional. 

## 2018-03-18 NOTE — ED Notes (Signed)
Patient ambulated to and from room commode with a steady gait.

## 2018-03-18 NOTE — Progress Notes (Signed)
[facility] Consultation      Assessment and Plan:  Suspected obstructive sleep apnea with with obesity hypoventilation syndrome with morbid obesity. - Patient has sleep study scheduled on 1/22, start on CPAP or BiPAP based on those recommendations. --Is currently on a CPAP machine, but is waiting for sleep study, initial study was not covered, will look into why was not covered.   Pulmonary edema. --Continued symptoms, will give dose of zaroxolyn x1, 2.5 mg every Monday can increase if needed, but will need to monitor potassium.  --Patient was given a return to work letter, for up to 4 hours/day, to use oxygen at 2 L with activity.  Chronic hypoxic respiratory failure. -Oxygen saturation is 89-90% at rest, she is advised that she should continue using oxygen at 2 L with ambulation particularly when leaving the home, and at night.  Is less important when she is sitting.  Pulmonary hypertension. 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 -Continue management of underlying conditions.  Meds ordered this encounter  Medications  . DISCONTD: metolazone (ZAROXOLYN) 2.5 MG tablet    Sig: Take 1 tablet (2.5 mg total) by mouth daily. Take once per week, every Monday.    Dispense:  8 tablet    Refill:  1   Return in about 6 weeks (around 04/30/2018).     Date: 03/18/2018  MRN# [id number] Christine Oneill [birth year]   Christine Oneill is a 57 y.o. old female seen in consultation for chief complaint of:    Chief Complaint  Patient presents with  . Sleep Apnea    pt did not get sleep study on 1/22 due to ins not covering. She still wears old cpap  . Shortness of Breath    pt has returned back to work. She says she is having more trouble with her breathing. She is on 2L cont.    HPI:   The patient is a 57 year old female, she was recently admitted to the hospital for acute respiratory failure due to acute diastolic congestive heart failure/cor  pulmonale.  She was suspected of having obstructive sleep apnea.  On a recent admission CO2 levels were  in the 37-41 range.  Last visit patient was sent for sleep apnea study however this was not covered by insurance, and was not performed.  She presented to the ED yesterday with increasing lower extremity edema and dyspnea, thought to be related to chronic CHF.  She was released to go back to work, but felt that her condition got work. She is to use oxygen, but work does not want her to use oxygen. She is on 2L chronically since her hospitalization in January of 2019.   Imaging personally reviewed, chest x-ray 03/18/18, lungs are unremarkable, slightly less pulmonary edema than on previous chest x-ray on 12/25/17 which showed; findings of chronic bronchitis, pulmonary edema cardiomegaly.  Desat walk at rest on RA, sat was 94% and HR 77. Sat dropped to 86% and HR 99 after 180 feet, started on 2L oxygen and sat was 91% at rest. Ambulated addition 180 feet with mild dyspnea with oxygen 2L sat was 90% and HR 113.   Medication:    Current Outpatient Medications:  .  albuterol (PROVENTIL HFA;VENTOLIN HFA) 108 (90 Base) MCG/ACT inhaler, Inhale 2 puffs into the lungs every 6 (six) hours as needed for wheezing or shortness of breath., Disp: 1 Inhaler, Rfl: 2 .  albuterol (PROVENTIL) (2.5 MG/3ML) 0.083% nebulizer solution, Take 3 mLs (2.5 mg total) by nebulization  every 6 (six) hours as needed for wheezing or shortness of breath., Disp: 75 mL, Rfl: 12 .  aspirin EC 81 MG EC tablet, Take 1 tablet (81 mg total) by mouth daily., Disp: 30 tablet, Rfl: 0 .  furosemide (LASIX) 40 MG tablet, Take 1 tablet (40 mg total) by mouth daily., Disp: 30 tablet, Rfl: 1 .  metoprolol succinate (TOPROL-XL) 25 MG 24 hr tablet, Take 0.5 tablets (12.5 mg total) by mouth daily. Hold if SBP<105, Disp: 30 tablet, Rfl: 0   Allergies:  Patient has no known allergies.  Review of Systems: Gen:  Denies  fever, sweats, chills HEENT:  Denies blurred vision, double vision. bleeds, sore throat Cvc:  No dizziness, chest pain. Resp:   Denies cough or sputum production, shortness of breath Gi: Denies swallowing difficulty, stomach pain. Gu:  Denies bladder incontinence, burning urine Ext:   No Joint pain, stiffness. Skin: No skin rash,  hives  Endoc:  No polyuria, polydipsia. Psych: No depression, insomnia. Other:  All other systems were reviewed with the patient and were negative other that what is mentioned in the HPI.   Physical Examination:   VS: BP 120/70 (BP Location: Left Arm, Cuff Size: Large)   Pulse 90   Ht 5\' 3"  (1.6 m)   Wt (!) 305 lb (138.3 kg)   LMP 03/12/2018   SpO2 98%   BMI 54.03 kg/m   General Appearance: No distress  Neuro:without focal findings,  speech normal,  HEENT: PERRLA, EOM intact.   Pulmonary: normal breath sounds, No wheezing.  CardiovascularNormal S1,S2.  No m/r/g.   Abdomen: Benign, Soft, non-tender. Renal:  No costovertebral tenderness  GU:  No performed at this time. Endoc: No evident thyromegaly, no signs of acromegaly. Skin:   warm, no rashes, no ecchymosis  Extremities: normal, no cyanosis, clubbing.  Other findings:    LABORATORY PANEL:   CBC Recent Labs  Lab 03/18/18 1041  WBC 6.6  HGB 13.1  HCT 41.2  PLT 283   ------------------------------------------------------------------------------------------------------------------  Chemistries  Recent Labs  Lab 03/18/18 1041  NA 138  K 4.1  CL 106  CO2 23  GLUCOSE 107*  BUN 19  CREATININE 0.82  CALCIUM 9.1   ------------------------------------------------------------------------------------------------------------------  Cardiac Enzymes Recent Labs  Lab 03/18/18 1041  TROPONINI <0.03   ------------------------------------------------------------  RADIOLOGY:  Dg Chest 2 View  Result Date: 03/18/2018 CLINICAL DATA:  Nonsmoker.  Recent diagnosis of CHF.  Swelling EXAM: CHEST - 2 VIEW COMPARISON:   None FINDINGS: Normal heart size. No pleural effusion or edema. No airspace opacities identified. The visualized osseous structures are unremarkable. IMPRESSION: 1. No evidence for CHF. Electronically Signed   By: Kerby Moors M.D.   On: 03/18/2018 11:57       Thank  you for the consultation and for allowing [facility] to assist in the care of your patient. Our recommendations are noted above.  Please contact us if we can be of further service.   Marda Stalker, MD.  Board Certified in Internal Medicine, Pulmonary Medicine, [place], and Sleep Medicine.  [facility] Office Number: [phone]  Patricia Pesa, M.D.  Merton Border, M.D  03/18/2018

## 2018-03-18 NOTE — Discharge Instructions (Addendum)
Continue to take your Lasix as prescribed.  Follow-up with Dr. Annett Fabian.  Return to the emergency department for new, worsening, or persistent swelling, extremity pain, difficulty breathing, weakness, chest pain, or any other new or worsening symptoms that concern you.

## 2018-03-18 NOTE — ED Triage Notes (Signed)
Pt arrived via POV from home with reports of swelling to left arm and left leg. Pt has hx of HF, states the swelling started yesterday morning.  Pt takes 40mg  Lasix for swelling, pt states she has not gained any weight in the past 2 days.   Left leg is tighter than the right, pitting edema present. Pt has increased swelling to left hand, states she is unable to remove class ring.  Pt is oxygen dependent-2Lcontinuous

## 2018-03-18 NOTE — ED Notes (Signed)
Pt reports some increased shortness of breath over the past month, despite using oxygen.

## 2018-03-18 NOTE — ED Provider Notes (Signed)
[facility] Emergency Department Provider Note ____________________________________________   First MD Initiated Contact with Patient 03/18/18 1126     (approximate)  I have reviewed the triage vital signs and the nursing notes.   HISTORY  Chief Complaint Leg Swelling; Arm Swelling; and Congestive Heart Failure    HPI Christine Oneill is a 57 y.o. female past medical history as noted below including relatively recent diagnosis of CHF who presents with swelling to bilateral lower extremities and the left arm over the last several days, gradual onset, associated with mild increased shortness of breath.  She reports pain in left lower leg from sciatica which is unchanged.  No cough, fever, vomiting.  She reports mild chest discomfort.  Past Medical History:  Diagnosis Date  . Abnormal uterine bleeding ([place])   . CHF (congestive heart failure) ([place])   . Hypertension   . Obstructive sleep apnea   . Pulmonary HTN [facility])     Patient Active Problem List   Diagnosis Date Noted  . HTN (hypertension) 01/04/2018  . Obstructive sleep apnea 01/04/2018  . Lymphedema 01/04/2018  . Pulmonary hypertension ([place]) 12/26/2017  . Obesity hypoventilation syndrome ([place]) 12/26/2017  . Smoker 12/26/2017  . CHF (congestive heart failure) ([place]) 12/25/2017    Past Surgical History:  Procedure Laterality Date  . APPENDECTOMY    . CESAREAN SECTION      Prior to Admission medications   Medication Sig Start Date End Date Taking? Authorizing Provider  albuterol (PROVENTIL HFA;VENTOLIN HFA) 108 (90 Base) MCG/ACT inhaler Inhale 2 puffs into the lungs every 6 (six) hours as needed for wheezing or shortness of breath. 12/29/17   Fritzi Mandes, MD  albuterol (PROVENTIL) (2.5 MG/3ML) 0.083% nebulizer solution Take 3 mLs (2.5 mg total) by nebulization every 6 (six) hours as needed for wheezing or shortness of breath. 12/29/17   Fritzi Mandes, MD  aspirin EC 81 MG EC tablet Take 1 tablet (81 mg  total) by mouth daily. 12/30/17   Fritzi Mandes, MD  furosemide (LASIX) 40 MG tablet Take 1 tablet (40 mg total) by mouth daily. 12/29/17   Fritzi Mandes, MD  metoprolol succinate (TOPROL-XL) 25 MG 24 hr tablet Take 0.5 tablets (12.5 mg total) by mouth daily. Hold if SBP<105 12/30/17   Fritzi Mandes, MD    Allergies Patient has no known allergies.  No family history on file.  Social History Social History   Tobacco Use  . Smoking status: Former Smoker    Packs/day: 1.00    Types: Cigarettes  . Smokeless tobacco: Never Used  Substance Use Topics  . Alcohol use: No  . Drug use: No    Review of Systems  Constitutional: No fever. Eyes: No redness. ENT: No sore throat. Cardiovascular: Positive for mild chest pain. Respiratory: Positive for shortness of breath. Gastrointestinal: No nausea, no vomiting.  Genitourinary: Negative for dysuria.  Musculoskeletal: Negative for back pain. Skin: Negative for rash. Neurological: Negative for headache.   ____________________________________________   PHYSICAL EXAM:  VITAL SIGNS: ED Triage Vitals  Enc Vitals Group     BP 03/18/18 1022 134/69     Pulse Rate 03/18/18 1022 87     Resp 03/18/18 1022 20     Temp 03/18/18 1022 98.4 F (36.9 C)     Temp Source 03/18/18 1022 Oral     SpO2 03/18/18 1022 93 %     Weight 03/18/18 1023 (!) 307 lb (139.3 kg)     Height 03/18/18 1023 5\' 3"  (1.6 m)  Head Circumference --      Peak Flow --      Pain Score 03/18/18 1036 0     Pain Loc --      Pain Edu? --      Excl. in [place]? --     Constitutional: Alert and oriented. Well appearing and in no acute distress. Eyes: Conjunctivae are normal.  Head: Atraumatic. Nose: No congestion/rhinnorhea. Mouth/Throat: Mucous membranes are moist.   Neck: Normal range of motion.  Cardiovascular: Normal rate, regular rhythm. Grossly normal heart sounds.  Good peripheral circulation. Respiratory: Normal respiratory effort.  No retractions.  Slightly decreased  breath sounds bilaterally.  No rales. Gastrointestinal: Soft and nontender. No distention.  Genitourinary: No CVA tenderness. Musculoskeletal: Mild bilateral lower extremity edema, L>R.  Extremities warm and well perfused.  Neurologic:  Normal speech and language. No gross focal neurologic deficits are appreciated.  Skin:  Skin is warm and dry. No rash noted. Psychiatric: Mood and affect are normal. Speech and behavior are normal.  ____________________________________________   LABS (all labs ordered are listed, but only abnormal results are displayed)  Labs Reviewed  BASIC METABOLIC PANEL - Abnormal; Notable for the following components:      Result Value   Glucose, Bld 107 (*)    All other components within normal limits  CBC - Abnormal; Notable for the following components:   MCHC 31.9 (*)    RDW 18.1 (*)    All other components within normal limits  TROPONIN I  BRAIN NATRIURETIC PEPTIDE  POC URINE PREG, ED   ____________________________________________  EKG  ED ECG REPORT I, Arta Silence, the attending physician, personally viewed and interpreted this ECG.  Date: 03/18/2018 EKG Time: 1034 Rate: 79 Rhythm: normal sinus rhythm QRS Axis: normal Intervals: normal ST/T Wave abnormalities: normal Narrative Interpretation: no evidence of acute ischemia; slightly low voltage QRS  ____________________________________________  RADIOLOGY  CXR: No infiltrate, significant edema, or other acute findings  ____________________________________________   PROCEDURES  Procedure(s) performed: No  Procedures  Critical Care performed: No ____________________________________________   INITIAL IMPRESSION / ASSESSMENT AND PLAN / ED COURSE  Pertinent labs & imaging results that were available during my care of the patient were reviewed by me and considered in my medical decision making (see chart for details).  57 year old female with past medical history as noted  above presents primarily with extremity swelling over the last several days associated with mild increased shortness of breath.  Patient is on p.o. Lasix at home for CHF.  She denies any weight increase, or any increase in her oxygen requirement (patient is on O2 for COPD).    On exam, the patient is relatively well-appearing, vitals are normal, and the remainder the exam is as described above.  There is mild peripheral edema.  No significant rales on lung exam.  Overall I suspect mild increased edema related to CHF.  Patient does not appear to be in a significant CHF exacerbation.  We will give IV Lasix, obtain chest x-ray and labs, and reassess.    ----------------------------------------- 1:21 PM on 03/18/2018 -----------------------------------------  X-ray does not show significant pulmonary edema, and the patient continues to appear comfortable.  The lab workup is reassuring.  Given the duration of symptoms there is no indication for repeat troponin.  At this time I suspect mild increased peripheral edema related to her CHF.  She will continue her p.o. Lasix at home.  I instructed her to follow-up with her cardiologist.  Return precautions given, and she expresses  understanding. ____________________________________________   FINAL CLINICAL IMPRESSION(S) / ED DIAGNOSES  Final diagnoses:  Peripheral edema      NEW MEDICATIONS STARTED DURING THIS VISIT:  New Prescriptions   No medications on file     Note:  This document was prepared using Dragon voice recognition software and may include unintentional dictation errors.    Arta Silence, MD 03/18/18 1322

## 2018-03-19 ENCOUNTER — Encounter: Payer: Self-pay | Admitting: Internal Medicine

## 2018-03-19 ENCOUNTER — Ambulatory Visit: Payer: BLUE CROSS/BLUE SHIELD | Admitting: Internal Medicine

## 2018-03-19 ENCOUNTER — Other Ambulatory Visit: Payer: Self-pay | Admitting: *Deleted

## 2018-03-19 VITALS — BP 120/70 | HR 90 | Ht 63.0 in | Wt 305.0 lb

## 2018-03-19 DIAGNOSIS — I272 Pulmonary hypertension, unspecified: Secondary | ICD-10-CM | POA: Diagnosis not present

## 2018-03-19 DIAGNOSIS — G4733 Obstructive sleep apnea (adult) (pediatric): Secondary | ICD-10-CM | POA: Diagnosis not present

## 2018-03-19 DIAGNOSIS — J9611 Chronic respiratory failure with hypoxia: Secondary | ICD-10-CM

## 2018-03-19 MED ORDER — METOLAZONE 2.5 MG PO TABS: 2.5000 mg | ORAL_TABLET | Freq: Every day | ORAL | 1 refills | Status: DC

## 2018-03-19 MED ORDER — METOLAZONE 2.5 MG PO TABS: ORAL_TABLET | ORAL | 1 refills | Status: DC

## 2018-03-19 NOTE — Patient Instructions (Signed)
Start zaroxolyn tablet every Monday.

## 2018-03-22 ENCOUNTER — Encounter: Payer: Self-pay | Admitting: Obstetrics and Gynecology

## 2018-03-22 ENCOUNTER — Other Ambulatory Visit (INDEPENDENT_AMBULATORY_CARE_PROVIDER_SITE_OTHER): Payer: BLUE CROSS/BLUE SHIELD

## 2018-03-22 ENCOUNTER — Ambulatory Visit (INDEPENDENT_AMBULATORY_CARE_PROVIDER_SITE_OTHER): Payer: BLUE CROSS/BLUE SHIELD | Admitting: Obstetrics and Gynecology

## 2018-03-22 ENCOUNTER — Other Ambulatory Visit: Payer: BLUE CROSS/BLUE SHIELD

## 2018-03-22 VITALS — BP 110/73 | HR 96 | Ht 63.0 in | Wt 304.9 lb

## 2018-03-22 DIAGNOSIS — N946 Dysmenorrhea, unspecified: Secondary | ICD-10-CM | POA: Diagnosis not present

## 2018-03-22 DIAGNOSIS — Z124 Encounter for screening for malignant neoplasm of cervix: Secondary | ICD-10-CM

## 2018-03-22 DIAGNOSIS — N924 Excessive bleeding in the premenopausal period: Secondary | ICD-10-CM

## 2018-03-22 DIAGNOSIS — D259 Leiomyoma of uterus, unspecified: Secondary | ICD-10-CM

## 2018-03-22 MED ORDER — NORETHINDRONE ACETATE 5 MG PO TABS: 5.0000 mg | ORAL_TABLET | Freq: Every day | ORAL | 2 refills | Status: DC

## 2018-03-22 NOTE — Progress Notes (Signed)
Pt is having heavy irregular cycles with cramping and clotting since Dec 29, 2017. Also noticed cramping before she feels a clot.

## 2018-03-22 NOTE — Patient Instructions (Signed)

## 2018-03-23 LAB — TSH: TSH: 1.34 u[IU]/mL (ref 0.450–4.500)

## 2018-03-25 ENCOUNTER — Encounter: Payer: Self-pay | Admitting: Obstetrics and Gynecology

## 2018-03-25 NOTE — Progress Notes (Signed)
GYNECOLOGY CLINIC PROGRESS NOTE Subjective:     Christine Oneill is an 57 y.o. [id number] woman who presents for irregular bleeding. She has been having irregular bleeding since January 2019.  She describes having 1-2 cycles per month, each lasting for ~ 2 weeks, alternating between light and heavy.  On heaviest days she is changing a pad every hour.  She also reports passage of quarter size clots, and notes an increase in cramping with periods.  She was seen 4 years ago for the same problem, and was diagnosed with uterine fibroids. She had an otherwise negative workup at that time.  She notes that since that time her periods had improved, and were regular again which is why she never had any further follow up.    Gynecologic History:  Menarche age: 55 Patient's last menstrual period was 03/12/2018.  Last pap smear: Approximately 4 years ago, was normal. Denies h/o abnormal pap smears.  Last mammogram: Last year. Notes normal results (although no results seen in Epic) Last colonoscopy: Patient has never had one.    Past Medical History:  Diagnosis Date  . Abnormal uterine bleeding (AUB)   . CHF (congestive heart failure) ([place])   . Hypertension   . Obstructive sleep apnea   . Pulmonary HTN ([place])    OB History  Gravida Para Term Preterm AB Living  2 2 2         SAB TAB Ectopic Multiple Live Births               # Outcome Date GA Lbr Len/2nd Weight Sex Delivery Anes PTL Lv  2 Term 11    F CS-Unspec     1 Term 22    M CS-Unspec        Family History  Problem Relation Age of Onset  . Lung cancer Mother   . Kidney failure Father   . Hypertension Sister     Past Surgical History:  Procedure Laterality Date  . APPENDECTOMY    . CESAREAN SECTION      Social History   Socioeconomic History  . Marital status: Single    Spouse name: Not on file  . Number of children: 2  . Years of education: 26  . Highest education level: 12th grade  Occupational History  . Not on file    Social Needs  . Financial resource strain: Somewhat hard  . Food insecurity:    Worry: Never true    Inability: Never true  . Transportation needs:    Medical: Yes    Non-medical: Yes  Tobacco Use  . Smoking status: Former Smoker    Packs/day: 1.00    Types: Cigarettes    Last attempt to quit: 12/25/2017    Years since quitting: 0.2  . Smokeless tobacco: Never Used  Substance and Sexual Activity  . Alcohol use: No  . Drug use: No  . Sexual activity: Never    Birth control/protection: None  Lifestyle  . Physical activity:    Days per week: 7 days    Minutes per session: 30 min  . Stress: Not at all  Relationships  . Social connections:    Talks on phone: More than three times a week    Gets together: More than three times a week    Attends religious service: More than 4 times per year    Active member of club or organization: No    Attends meetings of clubs or organizations: Never    Relationship status:  Divorced  . Intimate partner violence:    Fear of current or ex partner: Patient refused    Emotionally abused: Patient refused    Physically abused: Patient refused    Forced sexual activity: Patient refused  Other Topics Concern  . Not on file  Social History Narrative  . Not on file    Current Outpatient Medications on File Prior to Visit  Medication Sig Dispense Refill  . albuterol (PROVENTIL HFA;VENTOLIN HFA) 108 (90 Base) MCG/ACT inhaler Inhale 2 puffs into the lungs every 6 (six) hours as needed for wheezing or shortness of breath. 1 Inhaler 2  . albuterol (PROVENTIL) (2.5 MG/3ML) 0.083% nebulizer solution Take 3 mLs (2.5 mg total) by nebulization every 6 (six) hours as needed for wheezing or shortness of breath. 75 mL 12  . aspirin EC 81 MG EC tablet Take 1 tablet (81 mg total) by mouth daily. 30 tablet 0  . furosemide (LASIX) 40 MG tablet Take 1 tablet (40 mg total) by mouth daily. 30 tablet 1  . metolazone (ZAROXOLYN) 2.5 MG tablet Take once per week, every  Monday. 8 tablet 1  . metoprolol succinate (TOPROL-XL) 25 MG 24 hr tablet Take 0.5 tablets (12.5 mg total) by mouth daily. Hold if SBP<105 30 tablet 0   No current facility-administered medications on file prior to visit.     No Known Allergies   Review of Systems Pertinent items noted in HPI and remainder of comprehensive ROS otherwise negative.    Objective:    BP 110/73   Pulse 96   Ht 5\' 3"  (1.6 m)   Wt (!) 304 lb 14.4 oz (138.3 kg)   LMP 03/12/2018   BMI 54.01 kg/m   General:   alert, no distress and morbidly obese  Skin:    normal and no rash or abnormalities  Abdomen:  soft, non-tender; bowel sounds normal; no masses,  no organomegaly  Pelvic:   external genitalia normal, rectovaginal septum normal.  Vagina with scant dark red blood in vault, no discharge.  Cervix unable to be visualized, very anterior and high in the vagina, obscured by vaginal tissue.  Palpated normal, no motion tenderness.  Uterus difficult to palpate due to body habitus.   Adnexae non-palpable, nontender bilaterally.    Extremities:  extremities normal, atraumatic, no cyanosis or edema  Neurologic: Grossly normal        Labs:  Lab Results  Component Value Date   WBC 6.6 03/18/2018   HGB 13.1 03/18/2018   HCT 41.2 03/18/2018   MCV 91.7 03/18/2018   PLT 283 03/18/2018     Assessment:   Abnormal uterine bleeding (likely perimenopausal bleeding) Dysmenorrhea  Uterine fibroids  Morbid obesity  Cervical cancer screening  Plan:   - Patient has abnormal uterine bleeding . She had a difficult exam, unable to visualize cervix or assess uterine size due to body habitus, but no evidence of lesions. Recent labs note no evidence of anemia.  Will check TSH and order pelvic ultrasound to evaluate for any growth of uterine fibroids, or any other structural gynecologic abnormalities.  Patient will f/u in 1 week for discussion of results and further evaluation.   - Patient taking OTC meds for  dysmenorrhea.  - Will prescribe Aygestin to help with bleeding for now, to discuss further management options at next visit.  - Pap smear performed today.  Was planning to perform endometrial biopsy today as well (due to patient's risk factors of abnormal bleeding, perimenopausal age, and morbid obesity)  however was unable to visualize her cervix.

## 2018-03-28 ENCOUNTER — Encounter: Payer: Self-pay | Admitting: Obstetrics and Gynecology

## 2018-03-28 ENCOUNTER — Ambulatory Visit (INDEPENDENT_AMBULATORY_CARE_PROVIDER_SITE_OTHER): Payer: BLUE CROSS/BLUE SHIELD | Admitting: Obstetrics and Gynecology

## 2018-03-28 VITALS — BP 112/70 | HR 85 | Ht 63.0 in | Wt 304.3 lb

## 2018-03-28 DIAGNOSIS — Z86018 Personal history of other benign neoplasm: Secondary | ICD-10-CM | POA: Diagnosis not present

## 2018-03-28 DIAGNOSIS — N924 Excessive bleeding in the premenopausal period: Secondary | ICD-10-CM

## 2018-03-28 NOTE — Progress Notes (Signed)
Pt is doing well just want to know about her lab results. No other concerns.

## 2018-03-28 NOTE — Progress Notes (Signed)
GYNECOLOGY PROGRESS NOTE  Subjective:    Patient ID: Christine Oneill, female    DOB: Jul 31, 1961, 57 y.o.   MRN: [id number]  HPI  Patient is a 57 y.o. [id number] female with a h/o fibroids who presents for follow up of suspected abnormal perimenopausal bleeding and for pelvic ultrasound results. Patient notes that since last visit, she has stopped bleeding.  She began taking the Aygestin the following day after the appointment. Denies complaints today.    The following portions of the patient's history were reviewed and updated as appropriate: allergies, current medications, past family history, past medical history, past social history, past surgical history and problem list.  Review of Systems Pertinent items noted in HPI and remainder of comprehensive ROS otherwise negative.   Objective:   Blood pressure 112/70, pulse 85, height 5\' 3"  (1.6 m), weight (!) 304 lb 4.8 oz (138 kg), last menstrual period 03/12/2018.  Body mass index is 53.9 kg/m. General appearance: alert and no distress Remainder of exam deferred.    Labs:  Results for orders placed or performed in visit on 03/22/18  TSH  Result Value Ref Range   TSH 1.340 0.450 - 4.500 uIU/mL    Lab Results  Component Value Date   WBC 6.6 03/18/2018   HGB 13.1 03/18/2018   HCT 41.2 03/18/2018   MCV 91.7 03/18/2018   PLT 283 03/18/2018     Imaging:  ULTRASOUND REPORT  Location: [facility] Date of Service:  03/22/2018   Indications: Perimenopausal Bleeding Findings:  The uterus measures 8.7 x 5.7 x 4.6 cm. Echo texture is heterogeneous without evidence of focal masses. No discernable fibroids were found within the uterus, however, multiple areas of calcification were noted throughout.  The uterus was very difficult to penetrate. The Endometrium was unable to be delineated.  Right Ovary measures 3.3 x 1.9 x 1.8 cm. It is normal in appearance. Left Ovary was not visualized. A solid appearing mass is noted  in the right adnexa adjacent to the right ovary measuring 2.1 x 1.8 x 1.4 cm. There is no free fluid in the cul de sac.  Impression: 1. Anteflexed uterus appears heterogeneous and is difficult to penetrate.  Multiple calcification areas noted throughout. 2. Right ovary appears WNL.  Left ovary was not visualized. 3. Solid appearing mass in the right adnexa adjacent to the right ovary measuring 2.1 x 1.8 x 1.4 cm.  Recommendations: 1.Clinical correlation with the patient's History and Physical Exam.   Dario Ave, RDMS    I have reviewed this study and agree with documented findings.    Rubie Maid, MD [facility]   Assessment:   Perimenopausal abnormal bleeding H/o fibroids Morbid obesity  Plan:   1. Perimenopausal bleeding - no structural pathology noted on ultrasound.  Unable to visualize endometrium on ultrasound.  Discussed findings with patient.  Advised that with h/o morbid obesity, endometrial sampling would be preferred, but patient with a very difficult exam, unable to visualize cervix in office.  Alternative is for hormonal suppression (remain on Aygestin) and if no improvement in symptoms long term then proceed to the OR for endometrial sampling, and can perform an endometrial ablation at that time if desired.  Other options included placement of IUD device at time of sampling, or performing hysterectomy (and can send for pathology to r/o malignancy.  Patient will do hormonal suppression for now with Aygestin.  2. H/o fibroids - no fibroids noted on ultrasound today, but few areas of calcification  seen. No structural causes for her bleeding.  3. Morbid obesity - can be a risk factor for hyperplasia and malignancy.  Endometrial sampling is preferred but due to difficult exam last visit will try hormonal suppression.  If no response, can perform sampling in OR with treatment procedure of choice. 4. RTC in 3 months.    A total of 15 minutes were spent  face-to-face with the patient during this encounter and over half of that time dealt with counseling and coordination of care.   Rubie Maid, MD [facility]

## 2018-04-02 ENCOUNTER — Ambulatory Visit: Payer: BLUE CROSS/BLUE SHIELD | Admitting: Internal Medicine

## 2018-04-03 ENCOUNTER — Other Ambulatory Visit (HOSPITAL_COMMUNITY): Payer: Self-pay | Admitting: Internal Medicine

## 2018-04-03 DIAGNOSIS — D869 Sarcoidosis, unspecified: Secondary | ICD-10-CM

## 2018-04-04 LAB — IGP, COBASHPV16/18
HPV 16: POSITIVE — AB
HPV 18: NEGATIVE
HPV OTHER HR TYPES: NEGATIVE
PAP Smear Comment: 0

## 2018-04-08 ENCOUNTER — Emergency Department: Payer: BLUE CROSS/BLUE SHIELD

## 2018-04-08 ENCOUNTER — Other Ambulatory Visit: Payer: Self-pay

## 2018-04-08 ENCOUNTER — Emergency Department
Admission: EM | Admit: 2018-04-08 | Discharge: 2018-04-08 | Disposition: A | Discharge status: Home / Self Care | Payer: BLUE CROSS/BLUE SHIELD | Attending: Emergency Medicine | Admitting: Emergency Medicine

## 2018-04-08 DIAGNOSIS — I509 Heart failure, unspecified: Secondary | ICD-10-CM | POA: Insufficient documentation

## 2018-04-08 DIAGNOSIS — Z87891 Personal history of nicotine dependence: Secondary | ICD-10-CM | POA: Insufficient documentation

## 2018-04-08 DIAGNOSIS — I272 Pulmonary hypertension, unspecified: Secondary | ICD-10-CM

## 2018-04-08 DIAGNOSIS — Z7982 Long term (current) use of aspirin: Secondary | ICD-10-CM | POA: Insufficient documentation

## 2018-04-08 DIAGNOSIS — R0789 Other chest pain: Secondary | ICD-10-CM

## 2018-04-08 DIAGNOSIS — R079 Chest pain, unspecified: Secondary | ICD-10-CM | POA: Diagnosis present

## 2018-04-08 DIAGNOSIS — Z79899 Other long term (current) drug therapy: Secondary | ICD-10-CM | POA: Insufficient documentation

## 2018-04-08 LAB — BASIC METABOLIC PANEL
Anion gap: 8 (ref 5–15)
BUN: 19 mg/dL (ref 6–20)
CO2: 26 mmol/L (ref 22–32)
CREATININE: 1.28 mg/dL — AB (ref 0.44–1.00)
Calcium: 8.7 mg/dL — ABNORMAL LOW (ref 8.9–10.3)
Chloride: 103 mmol/L (ref 101–111)
GFR calc Af Amer: 54 mL/min — ABNORMAL LOW (ref >60)
GFR calc non Af Amer: 46 mL/min — ABNORMAL LOW (ref >60)
Glucose, Bld: 110 mg/dL — ABNORMAL HIGH (ref 65–99)
Potassium: 3.8 mmol/L (ref 3.5–5.1)
Sodium: 137 mmol/L (ref 135–145)

## 2018-04-08 LAB — CBC
HEMATOCRIT: 37.3 % (ref 35.0–47.0)
Hemoglobin: 12.6 g/dL (ref 12.0–16.0)
MCH: 31.1 pg (ref 26.0–34.0)
MCHC: 33.8 g/dL (ref 32.0–36.0)
MCV: 92.1 fL (ref 80.0–100.0)
PLATELETS: 293 10*3/uL (ref 150–440)
RBC: 4.05 MIL/uL (ref 3.80–5.20)
RDW: 17.1 % — AB (ref 11.5–14.5)
WBC: 8.6 10*3/uL (ref 3.6–11.0)

## 2018-04-08 LAB — TROPONIN I
Troponin I: <0.03 ng/mL (ref <0.03)
Troponin I: <0.03 ng/mL (ref <0.03)

## 2018-04-08 LAB — BRAIN NATRIURETIC PEPTIDE: B NATRIURETIC PEPTIDE 5: 23 pg/mL (ref 0.0–100.0)

## 2018-04-08 NOTE — ED Provider Notes (Signed)
[facility] Emergency Department Provider Note  ____________________________________________   First MD Initiated Contact with Patient 04/08/18 [phone]     (approximate)  I have reviewed the triage vital signs and the nursing notes.   HISTORY  Chief Complaint Chest Pain and Shortness of Breath   HPI Christine Oneill is a 56 y.o. female who comes to the emergency department with atypical chest pain.  Pain is sharp and nonexertional.  Some shortness of breath.  The pain is in her left chest and radiates down her left arm.  Is constant.  No leg swelling.  No history of DVT or pulmonary embolism.  She does have a history of pulmonary hypertension.  She is not oxygen dependent at home.  No fevers or chills.  She is never had a heart attack.  12/26/17 Echo: Study Conclusions  - Left ventricle: The cavity size was normal. Systolic function was   normal. The estimated ejection fraction was in the range of 60%   to 65%. - Aortic valve: There was trivial regurgitation. Valve area (Vmax):   2.96 cm^2. - Right ventricle: The cavity size was moderately dilated. - Right atrium: The appendage was moderately dilated. - Tricuspid valve: There was moderate regurgitation.  Impressions:  - The right ventricular systolic pressure was increased consistent   with severe pulmonary hypertension.   Past Medical History:  Diagnosis Date  . Abnormal uterine bleeding (AUB)   . CHF (congestive heart failure) ([place])   . Hypertension   . Obstructive sleep apnea   . Pulmonary HTN [facility])     Patient Active Problem List   Diagnosis Date Noted  . HTN (hypertension) 01/04/2018  . Obstructive sleep apnea 01/04/2018  . Lymphedema 01/04/2018  . Pulmonary hypertension ([place]) 12/26/2017  . Obesity hypoventilation syndrome ([place]) 12/26/2017  . Smoker 12/26/2017  . CHF (congestive heart failure) ([place]) 12/25/2017    Past Surgical History:  Procedure Laterality Date  . APPENDECTOMY    .  CESAREAN SECTION      Prior to Admission medications   Medication Sig Start Date End Date Taking? Authorizing Provider  albuterol (PROVENTIL HFA;VENTOLIN HFA) 108 (90 Base) MCG/ACT inhaler Inhale 2 puffs into the lungs every 6 (six) hours as needed for wheezing or shortness of breath. 12/29/17  Yes Fritzi Mandes, MD  albuterol (PROVENTIL) (2.5 MG/3ML) 0.083% nebulizer solution Take 3 mLs (2.5 mg total) by nebulization every 6 (six) hours as needed for wheezing or shortness of breath. 12/29/17  Yes Fritzi Mandes, MD  aspirin EC 81 MG EC tablet Take 1 tablet (81 mg total) by mouth daily. 12/30/17  Yes Fritzi Mandes, MD  furosemide (LASIX) 40 MG tablet Take 1 tablet (40 mg total) by mouth daily. 12/29/17  Yes Fritzi Mandes, MD  metolazone (ZAROXOLYN) 2.5 MG tablet Take once per week, every Monday. 03/19/18  Yes Laverle Hobby, MD  metoprolol succinate (TOPROL-XL) 25 MG 24 hr tablet Take 0.5 tablets (12.5 mg total) by mouth daily. Hold if SBP<105 12/30/17  Yes Fritzi Mandes, MD  norethindrone (AYGESTIN) 5 MG tablet Take 1 tablet (5 mg total) by mouth daily. 03/22/18  Yes Rubie Maid, MD  metroNIDAZOLE (FLAGYL) 500 MG tablet Take 1 tablet (500 mg total) by mouth 1 day or 1 dose for 1 dose. 04/12/18 04/13/18  Rubie Maid, MD    Allergies Patient has no known allergies.  Family History  Problem Relation Age of Onset  . Lung cancer Mother   . Kidney failure Father   . Hypertension Sister  Social History Social History   Tobacco Use  . Smoking status: Former Smoker    Packs/day: 1.00    Types: Cigarettes    Last attempt to quit: 12/25/2017    Years since quitting: 0.2  . Smokeless tobacco: Never Used  Substance Use Topics  . Alcohol use: No  . Drug use: No    Review of Systems Constitutional: No fever/chills Eyes: No visual changes. ENT: No sore throat. Cardiovascular: Positive for chest pain. Respiratory: Positive for shortness of breath. Gastrointestinal: No abdominal pain.  No nausea,  no vomiting.  No diarrhea.  No constipation. Genitourinary: Negative for dysuria. Musculoskeletal: Negative for back pain. Skin: Negative for rash. Neurological: Negative for headaches, focal weakness or numbness.   ____________________________________________   PHYSICAL EXAM:  VITAL SIGNS: ED Triage Vitals  Enc Vitals Group     BP 04/08/18 0049 138/79     Pulse Rate 04/08/18 0049 90     Resp 04/08/18 0049 20     Temp 04/08/18 0049 98.1 F (36.7 C)     Temp Source 04/08/18 0049 Oral     SpO2 04/08/18 0049 96 %     Weight --      Height --      Head Circumference --      Peak Flow --      Pain Score 04/08/18 0045 8     Pain Loc --      Pain Edu? --      Excl. in [place]? --     Constitutional: Alert and oriented x4 pleasant cooperative speaks full clear sentences no diaphoresis Eyes: PERRL EOMI. Head: Atraumatic. Nose: No congestion/rhinnorhea. Mouth/Throat: No trismus Neck: No stridor.  Able lie completely flat with no JVD Cardiovascular: Normal rate, regular rhythm. Grossly normal heart sounds.  Good peripheral circulation. Respiratory: Normal respiratory effort.  No retractions. Lungs CTAB and moving good air Gastrointestinal: Soft nontender Musculoskeletal: No lower extremity edema legs are equal in size Neurologic:  Normal speech and language. No gross focal neurologic deficits are appreciated. Skin:  Skin is warm, dry and intact. No rash noted. Psychiatric: Mood and affect are normal. Speech and behavior are normal.    ____________________________________________   DIFFERENTIAL includes but not limited to  Pulmonary hypertension, congestive heart failure, acute coronary syndrome, pneumothorax, pulmonary embolism ____________________________________________   LABS (all labs ordered are listed, but only abnormal results are displayed)  Labs Reviewed  BASIC METABOLIC PANEL - Abnormal; Notable for the following components:      Result Value   Glucose, Bld 110  (*)    Creatinine, Ser 1.28 (*)    Calcium 8.7 (*)    GFR calc non Af Amer 46 (*)    GFR calc Af Amer 54 (*)    All other components within normal limits  CBC - Abnormal; Notable for the following components:   RDW 17.1 (*)    All other components within normal limits  TROPONIN I  TROPONIN I  BRAIN NATRIURETIC PEPTIDE    Lab work reviewed by me shows no signs of acute ischemia x2 __________________________________________  EKG  ED ECG REPORT I, Darel Hong, the attending physician, personally viewed and interpreted this ECG.  Date: 04/08/2018 EKG Time:  Rate: 95 Rhythm: normal sinus rhythm QRS Axis: Rightward axis unchanged from previous EKG Intervals: normal ST/T Wave abnormalities: normal Narrative Interpretation: no evidence of acute ischemia  ____________________________________________  RADIOLOGY  Chest x-ray reviewed by me consistent with mild congestion ____________________________________________   PROCEDURES  Procedure(s) performed:  no  Procedures  Critical Care performed: no  Observation: no ____________________________________________   INITIAL IMPRESSION / ASSESSMENT AND PLAN / ED COURSE  Pertinent labs & imaging results that were available during my care of the patient were reviewed by me and considered in my medical decision making (see chart for details).  The patient has somewhat atypical chest pain.  EKG is nonischemic.  2 troponins are negative.  Her pain is improved.  I do lengthy discussion the patient regarding the diagnostic uncertainty and importance of following up for possible provocative testing.  Strict return precautions have been given to the patient verbalized understanding and agreement the plan.      ____________________________________________   FINAL CLINICAL IMPRESSION(S) / ED DIAGNOSES  Final diagnoses:  Pulmonary HTN ([place])  Atypical chest pain      NEW MEDICATIONS STARTED DURING THIS VISIT:  Discharge  Medication List as of 04/08/2018  5:04 AM       Note:  This document was prepared using Dragon voice recognition software and may include unintentional dictation errors.     Darel Hong, MD 04/12/18 2136

## 2018-04-08 NOTE — Discharge Instructions (Signed)
Please continue taking all of your medications as prescribed and follow-up with your primary care physician this coming week for recheck.  Return to the emergency department sooner for any concerns.  It was a pleasure to take care of you today, and thank you for coming to our emergency department.  If you have any questions or concerns before leaving please ask the nurse to grab me and I'm more than happy to go through your aftercare instructions again.  If you were prescribed any opioid pain medication today such as Norco, Vicodin, Percocet, morphine, hydrocodone, or oxycodone please make sure you do not drive when you are taking this medication as it can alter your ability to drive safely.  If you have any concerns once you are home that you are not improving or are in fact getting worse before you can make it to your follow-up appointment, please do not hesitate to call 911 and come back for further evaluation.  Darel Hong, MD  Results for orders placed or performed during the hospital encounter of 63/01/60  Basic metabolic panel  Result Value Ref Range   Sodium 137 135 - 145 mmol/L   Potassium 3.8 3.5 - 5.1 mmol/L   Chloride 103 101 - 111 mmol/L   CO2 26 22 - 32 mmol/L   Glucose, Bld 110 (H) 65 - 99 mg/dL   BUN 19 6 - 20 mg/dL   Creatinine, Ser 1.28 (H) 0.44 - 1.00 mg/dL   Calcium 8.7 (L) 8.9 - 10.3 mg/dL   GFR calc non Af Amer 46 (L) >60 mL/min   GFR calc Af Amer 54 (L) >60 mL/min   Anion gap 8 5 - 15  CBC  Result Value Ref Range   WBC 8.6 3.6 - 11.0 K/uL   RBC 4.05 3.80 - 5.20 MIL/uL   Hemoglobin 12.6 12.0 - 16.0 g/dL   HCT 37.3 35.0 - 47.0 %   MCV 92.1 80.0 - 100.0 fL   MCH 31.1 26.0 - 34.0 pg   MCHC 33.8 32.0 - 36.0 g/dL   RDW 17.1 (H) 11.5 - 14.5 %   Platelets 293 150 - 440 K/uL  Troponin I  Result Value Ref Range   Troponin I <0.03 <0.03 ng/mL  Troponin I  Result Value Ref Range   Troponin I <0.03 <0.03 ng/mL  Brain natriuretic peptide  Result Value Ref Range     B Natriuretic Peptide 23.0 0.0 - 100.0 pg/mL   Dg Chest 2 View  Result Date: 04/08/2018 CLINICAL DATA:  Acute onset of left-sided chest pain and shortness of breath. EXAM: CHEST - 2 VIEW COMPARISON:  Chest radiograph performed 03/18/2018 FINDINGS: The lungs are well-aerated. Mild bibasilar atelectasis is noted. Mild vascular congestion is noted. There is no evidence of pleural effusion or pneumothorax. The heart is normal in size; the mediastinal contour is within normal limits. No acute osseous abnormalities are seen. IMPRESSION: Mild bibasilar atelectasis.  Mild vascular congestion. Electronically Signed   By: Garald Balding M.D.   On: 04/08/2018 01:23   Dg Chest 2 View  Result Date: 03/18/2018 CLINICAL DATA:  Nonsmoker.  Recent diagnosis of CHF.  Swelling EXAM: CHEST - 2 VIEW COMPARISON:  None FINDINGS: Normal heart size. No pleural effusion or edema. No airspace opacities identified. The visualized osseous structures are unremarkable. IMPRESSION: 1. No evidence for CHF. Electronically Signed   By: Kerby Moors M.D.   On: 03/18/2018 11:57   US Pelvis Transvanginal Non-ob (tv Only)  Result Date: 03/24/2018 ULTRASOUND REPORT  Location: [facility] Date of Service:  03/22/2018 Indications: PMB Findings: The uterus measures 8.7 x 5.7 x 4.6 cm. Echo texture is heterogeneous without evidence of focal masses. No discernable fibroids were found within the uterus, however, multiple areas of calcification were noted throughout.  The uterus was very difficult to penetrate. The Endometrium was unable to be delineated. Right Ovary measures 3.3 x 1.9 x 1.8 cm. It is normal in appearance. Left Ovary was not visualized. A solid appearing mass is noted in the right adnexa adjacent to the right ovary measuring 2.1 x 1.8 x 1.4 cm. There is no free fluid in the cul de sac. Impression: 1. Anteflexed uterus appears heterogeneous and is difficult to penetrate.  Multiple calcification areas noted throughout. 2.  Right ovary appears WNL.  Left ovary was not visualized. 3. Solid appearing mass in the right adnexa adjacent to the right ovary measuring 2.1 x 1.8 x 1.4 cm. Recommendations: 1.Clinical correlation with the patient's History and Physical Exam. Dario Ave, RDMS I have reviewed this study and agree with documented findings. Rubie Maid, MD [facility]

## 2018-04-08 NOTE — ED Triage Notes (Signed)
Patient reports chest pain and increased shortness of breath tonight.  Reports pain to left chest and radiates down left arm.

## 2018-04-12 ENCOUNTER — Other Ambulatory Visit: Payer: Self-pay

## 2018-04-12 MED ORDER — METRONIDAZOLE 500 MG PO TABS: 500.0000 mg | ORAL_TABLET | ORAL | 1 refills | Status: DC

## 2018-04-13 ENCOUNTER — Other Ambulatory Visit: Payer: Self-pay

## 2018-04-13 MED ORDER — METRONIDAZOLE 500 MG PO TABS: 2000.0000 mg | ORAL_TABLET | Freq: Once | ORAL | 1 refills | Status: AC

## 2018-04-18 ENCOUNTER — Telehealth: Payer: Self-pay | Admitting: Obstetrics and Gynecology

## 2018-04-18 NOTE — Telephone Encounter (Signed)
The patient called and stated that she would like to have Dr. Andreas Blower nurse give her a call back. Please advise.

## 2018-04-19 NOTE — Telephone Encounter (Signed)
Pt was called back and informed that she need to schedule her colpo as soon as possible. Pt was transferred to the front desk for assistance with setting an appt.

## 2018-04-25 ENCOUNTER — Telehealth: Payer: Self-pay | Admitting: Obstetrics and Gynecology

## 2018-04-25 NOTE — Telephone Encounter (Signed)
The patient called and stated that she would like to speak with Dr. Andreas Blower nurse as soon as she can. Please advise.

## 2018-04-25 NOTE — Telephone Encounter (Signed)
Pt was called back and informed staff that she had been heavily bleeding for a week. The medication that was prescribed by [facility] worked for a day but stopped. Heavy periods with clots and pain. Pt stated that she was using 2 pads at one time and had changed her pads 6 times since 8am this morning. Pt stated that she was tired and drained and had no energy. Pt is making an appt to see AC.

## 2018-04-26 ENCOUNTER — Ambulatory Visit (INDEPENDENT_AMBULATORY_CARE_PROVIDER_SITE_OTHER): Payer: BLUE CROSS/BLUE SHIELD | Admitting: Obstetrics and Gynecology

## 2018-04-26 ENCOUNTER — Encounter: Payer: Self-pay | Admitting: Obstetrics and Gynecology

## 2018-04-26 VITALS — BP 117/75 | HR 87 | Ht 63.0 in | Wt 301.7 lb

## 2018-04-26 DIAGNOSIS — R87810 Cervical high risk human papillomavirus (HPV) DNA test positive: Secondary | ICD-10-CM

## 2018-04-26 DIAGNOSIS — N921 Excessive and frequent menstruation with irregular cycle: Secondary | ICD-10-CM | POA: Diagnosis not present

## 2018-04-26 DIAGNOSIS — Z6841 Body Mass Index (BMI) 40.0 and over, adult: Secondary | ICD-10-CM | POA: Diagnosis not present

## 2018-04-26 MED ORDER — MEDROXYPROGESTERONE ACETATE 10 MG PO TABS: 20.0000 mg | ORAL_TABLET | Freq: Every day | ORAL | 2 refills | Status: DC

## 2018-04-26 NOTE — Progress Notes (Signed)
GYNECOLOGY PROGRESS NOTE  Subjective:    Patient ID: Christine Oneill, female    DOB: 04-Sep-1961, 57 y.o.   MRN: [id number]  HPI  Patient is a 57 y.o. G75P2000 female who presents for complaints of continued abnormal uterine bleeding.  Patient was last seen in April after noting complaints of abnormal bleeding. She had been initiated on Aygestin, and had noted that the medication was working well for her. Bleeding resumed last week where she began to experience heavy bleeding, passage of clots.  She notes she was having to use 2 pads at a time, and was changing pads every 30 minutes.  She is feeling tired and drained. Denies SOB, chest pain.   The following portions of the patient's history were reviewed and updated as appropriate: allergies, current medications, past family history, past medical history, past social history, past surgical history and problem list.  Review of Systems Pertinent items noted in HPI and remainder of comprehensive ROS otherwise negative.   Objective:   Blood pressure 117/75, pulse 87, height 5\' 3"  (1.6 m), weight (!) 301 lb 11.2 oz (136.9 kg). Body mass index is 53.44 kg/m.  General appearance: alert and no distress, morbid obesity See H&P for remainder of exam.    Labs:  Lab Results  Component Value Date   WBC 8.6 04/08/2018   HGB 12.6 04/08/2018   HCT 37.3 04/08/2018   MCV 92.1 04/08/2018   PLT 293 04/08/2018    Assessment:   Abnormal uterine bleeding Morbid obesity H/o abnormal pap smear Comorbidities (Pulmonary HTN, CHF)  Plan:   - Patient with continued abnormal uterine bleeding despite use of Aygestin.  Will change to high dose Provera in light of symptoms.  Patient to take 20 mg daily.  Had discussed previously with patient that if she continued to have bleeding she would need to have endometrial sampling performed in the OR due to inability to identify her cervix during previous exams.  Will schedule for Hysteroscopy D&C in the operating room  on 04/30/2018.  - The risks of surgery were discussed in detail with the patient including but not limited to: bleeding which may require transfusion or reoperation; infection which may require prolonged hospitalization or re-hospitalization and antibiotic therapy; injury to bowel, bladder, and major vessels or other surrounding organs; need for additional procedures including laparotomy; thromboembolic phenomenon, incisional problems and other postoperative or anesthesia complications.  Patient was told that the likelihood that her condition and symptoms will be treated effectively with this surgical management was very high; the postoperative expectations were also discussed in detail. The patient also understands the alternative treatment options which were discussed in full. All questions were answered.  She was told that she will be contacted by our surgical scheduler regarding the time and date of her surgery; routine preoperative instructions of having nothing to eat or drink after midnight on the day prior to surgery and also coming to the hospital 1.5 hours prior to her time of surgery were also emphasized. Printed patient education handouts about the procedure were given to the patient to review at home. - Patient also with recent abnormal pap smear (NILM but HPV+).  Will attempt to perform colposcopy in OR preceding Hysteroscopy.  - Patient with several comorbidities (Pulmonary HTN, CHF). Notes that her cardiologist has had difficulties with insurance company on getting her Echo covered, unsure of what last EF was.  Last EKG non-concerning, performed in ER 03/2018. Can discuss with anesthesia least risky method of induction based  on risk.    A total of 15 minutes were spent face-to-face with the patient during this encounter and over half of that time dealt with counseling and coordination of care.   Rubie Maid, MD [facility]

## 2018-04-26 NOTE — H&P (Signed)
GYNECOLOGY PREOPERATIVE HISTORY AND PHYSICAL   Subjective:  Christine Oneill is a 57 y.o. L2G4010 here for surgical management of abnormal uterine bleeding and abnormal pap smear.   Indications for procedure include: morbid obesity, abnormal uterine bleeding, inability to perform in office endometrial sampling, and HR HPV+ pap smear. No significant preoperative concerns.  Proposed surgery: Hysteroscopy D&C with colposcopy    Pertinent Gynecological History: Menses: flow is excessive with use of 2 pads every 30 minutes Bleeding: perimenopausal abnormal bleeding Contraception: None Last mammogram:Last year. Notes normal results (although no results seen in Epic) Last pap: abnormal: NILM with HR HPV+ (type 16) Date: 03/2018   Past Medical History:  Diagnosis Date  . Abnormal uterine bleeding (AUB)   . CHF (congestive heart failure) ([place])   . Hypertension   . Obstructive sleep apnea   . Pulmonary HTN ([place])    Past Surgical History:  Procedure Laterality Date  . APPENDECTOMY    . CESAREAN SECTION     OB History  Gravida Para Term Preterm AB Living  2 2 2         SAB TAB Ectopic Multiple Live Births               # Outcome Date GA Lbr Len/2nd Weight Sex Delivery Anes PTL Lv  2 Term 65    F CS-Unspec     1 Term 43    M CS-Unspec       Family History  Problem Relation Age of Onset  . Lung cancer Mother   . Kidney failure Father   . Hypertension Sister     Social History   Socioeconomic History  . Marital status: Single    Spouse name: Not on file  . Number of children: 2  . Years of education: 87  . Highest education level: 12th grade  Occupational History  . Not on file  Social Needs  . Financial resource strain: Somewhat hard  . Food insecurity:    Worry: Never true    Inability: Never true  . Transportation needs:    Medical: Yes    Non-medical: Yes  Tobacco Use  . Smoking status: Former Smoker    Packs/day: 1.00    Types: Cigarettes    Last attempt  to quit: 12/25/2017    Years since quitting: 0.3  . Smokeless tobacco: Never Used  Substance and Sexual Activity  . Alcohol use: No  . Drug use: No  . Sexual activity: Never    Birth control/protection: None  Lifestyle  . Physical activity:    Days per week: 7 days    Minutes per session: 30 min  . Stress: Not at all  Relationships  . Social connections:    Talks on phone: More than three times a week    Gets together: More than three times a week    Attends religious service: More than 4 times per year    Active member of club or organization: No    Attends meetings of clubs or organizations: Never    Relationship status: Divorced  . Intimate partner violence:    Fear of current or ex partner: Patient refused    Emotionally abused: Patient refused    Physically abused: Patient refused    Forced sexual activity: Patient refused  Other Topics Concern  . Not on file  Social History Narrative  . Not on file    Current Outpatient Medications on File Prior to Visit  Medication Sig Dispense Refill  .  albuterol (PROVENTIL HFA;VENTOLIN HFA) 108 (90 Base) MCG/ACT inhaler Inhale 2 puffs into the lungs every 6 (six) hours as needed for wheezing or shortness of breath. 1 Inhaler 2  . albuterol (PROVENTIL) (2.5 MG/3ML) 0.083% nebulizer solution Take 3 mLs (2.5 mg total) by nebulization every 6 (six) hours as needed for wheezing or shortness of breath. 75 mL 12  . aspirin EC 81 MG EC tablet Take 1 tablet (81 mg total) by mouth daily. (Patient not taking: Reported on 04/26/2018) 30 tablet 0  . furosemide (LASIX) 40 MG tablet Take 1 tablet (40 mg total) by mouth daily. 30 tablet 1  . metolazone (ZAROXOLYN) 2.5 MG tablet Take once per week, every Monday. (Patient taking differently: Take 2.5 mg by mouth every Monday. Take once per week, every Monday.) 8 tablet 1  . metoprolol succinate (TOPROL-XL) 25 MG 24 hr tablet Take 0.5 tablets (12.5 mg total) by mouth daily. Hold if SBP<105 30 tablet 0   No  current facility-administered medications on file prior to visit.    No Known Allergies   Review of Systems Constitutional: No recent fever/chills/sweats Respiratory: No recent cough/bronchitis Cardiovascular: No chest pain Gastrointestinal: No recent nausea/vomiting/diarrhea Genitourinary: No UTI symptoms Hematologic/lymphatic:No history of coagulopathy or recent blood thinner use    Objective:   Blood pressure 117/75, pulse 87, height 5\' 3"  (1.6 m), weight (!) 301 lb 11.2 oz (136.9 kg). CONSTITUTIONAL: Well-developed, well-nourished female in no acute distress.  HENT:  Normocephalic, atraumatic, External right and left ear normal. Oropharynx is clear and moist EYES: Conjunctivae and EOM are normal. Pupils are equal, round, and reactive to light. No scleral icterus.  NECK: Normal range of motion, supple, no masses SKIN: Skin is warm and dry. No rash noted. Not diaphoretic. No erythema. No pallor.  NEUROLOGIC: Alert and oriented to person, place, and time. Normal reflexes, muscle tone coordination. No cranial nerve deficit noted. PSYCHIATRIC: Normal mood and affect. Normal behavior. Normal judgment and thought content. CARDIOVASCULAR: Normal heart rate noted, regular rhythm RESPIRATORY: Effort and breath sounds normal, no problems with respiration noted ABDOMEN: Soft, nontender, nondistended. PELVIC: Deferred MUSCULOSKELETAL: Normal range of motion. No edema and no tenderness. 2+ distal pulses.    Labs: No results found for this or any previous visit (from the past 336 hour(s)).   Imaging Studies: Dg Chest 2 View  Result Date: 04/08/2018 CLINICAL DATA:  Acute onset of left-sided chest pain and shortness of breath. EXAM: CHEST - 2 VIEW COMPARISON:  Chest radiograph performed 03/18/2018 FINDINGS: The lungs are well-aerated. Mild bibasilar atelectasis is noted. Mild vascular congestion is noted. There is no evidence of pleural effusion or pneumothorax. The heart is normal in size;  the mediastinal contour is within normal limits. No acute osseous abnormalities are seen. IMPRESSION: Mild bibasilar atelectasis.  Mild vascular congestion. Electronically Signed   By: Garald Balding M.D.   On: 04/08/2018 01:23     ULTRASOUND REPORT  Location: [facility] Date of Service:  03/22/2018   Indications: PMB Findings:  The uterus measures 8.7 x 5.7 x 4.6 cm. Echo texture is heterogeneous without evidence of focal masses. No discernable fibroids were found within the uterus, however, multiple areas of calcification were noted throughout.  The uterus was very difficult to penetrate. The Endometrium was unable to be delineated.  Right Ovary measures 3.3 x 1.9 x 1.8 cm. It is normal in appearance. Left Ovary was not visualized. A solid appearing mass is noted in the right adnexa adjacent to the right ovary  measuring 2.1 x 1.8 x 1.4 cm. There is no free fluid in the cul de sac.  Impression: 1. Anteflexed uterus appears heterogeneous and is difficult to penetrate.  Multiple calcification areas noted throughout. 2. Right ovary appears WNL.  Left ovary was not visualized. 3. Solid appearing mass in the right adnexa adjacent to the right ovary measuring 2.1 x 1.8 x 1.4 cm.  Recommendations: 1.Clinical correlation with the patient's History and Physical Exam.   Dario Ave, RDMS    I have reviewed this study and agree with documented findings.    Rubie Maid, MD [facility]    Assessment:    Menorrhagia with irregular cycle  Morbid obesity ([place]) HPV infection Comorbidities (CHF, pulmonary HTN) Plan:    Counseling: Procedure, risks, reasons, benefits and complications (including injury to bowel, bladder, major blood vessel, ureter, bleeding, possibility of transfusion, infection, or fistula formation) reviewed in detail. Likelihood of success in alleviating the patient's condition was discussed. Routine postoperative instructions  will be reviewed with the patient and her family in detail after surgery.  The patient concurred with the proposed plan, giving informed written consent for the surgery.   Preop testing ordered. Instructions reviewed, including NPO after midnight.      Rubie Maid, MD [facility]

## 2018-04-26 NOTE — Patient Instructions (Signed)
Hysteroscopy Hysteroscopy is a procedure used for looking inside the womb (uterus). It may be done for various reasons, including:  To evaluate abnormal bleeding, fibroid (benign, noncancerous) tumors, polyps, scar tissue (adhesions), and possibly cancer of the uterus.  To look for lumps (tumors) and other uterine growths.  To look for causes of why a woman cannot get pregnant (infertility), causes of recurrent loss of pregnancy (miscarriages), or a lost intrauterine device (IUD).  To perform a sterilization by blocking the fallopian tubes from inside the uterus.  In this procedure, a thin, flexible tube with a tiny light and camera on the end of it (hysteroscope) is used to look inside the uterus. A hysteroscopy should be done right after a menstrual period to be sure you are not pregnant. LET [facility] CARE PROVIDER KNOW ABOUT:  Any allergies you have.  All medicines you are taking, including vitamins, herbs, eye drops, creams, and over-the-counter medicines.  Previous problems you or members of your family have had with the use of anesthetics.  Any blood disorders you have.  Previous surgeries you have had.  Medical conditions you have. RISKS AND COMPLICATIONS Generally, this is a safe procedure. However, as with any procedure, complications can occur. Possible complications include:  Putting a hole in the uterus.  Excessive bleeding.  Infection.  Damage to the cervix.  Injury to other organs.  Allergic reaction to medicines.  Too much fluid used in the uterus for the procedure.  BEFORE THE PROCEDURE  Ask your health care provider about changing or stopping any regular medicines.  Do not take aspirin or blood thinners for 1 week before the procedure, or as directed by your health care provider. These can cause bleeding.  If you smoke, do not smoke for 2 weeks before the procedure.  In some cases, a medicine is placed in the cervix the day before the procedure.  This medicine makes the cervix have a larger opening (dilate). This makes it easier for the instrument to be inserted into the uterus during the procedure.  Do not eat or drink anything for at least 8 hours before the surgery.  Arrange for someone to take you home after the procedure. PROCEDURE  You may be given a medicine to relax you (sedative). You may also be given one of the following: ? A medicine that numbs the area around the cervix (local anesthetic). ? A medicine that makes you sleep through the procedure (general anesthetic).  The hysteroscope is inserted through the vagina into the uterus. The camera on the hysteroscope sends a picture to a TV screen. This gives the surgeon a good view inside the uterus.  During the procedure, air or a liquid is put into the uterus, which allows the surgeon to see better.  Sometimes, tissue is gently scraped from inside the uterus. These tissue samples are sent to a lab for testing. What to expect after the procedure  If you had a general anesthetic, you may be groggy for a couple hours after the procedure.  If you had a local anesthetic, you will be able to go home as soon as you are stable and feel ready.  You may have some cramping. This normally lasts for a couple days.  You may have bleeding, which varies from light spotting for a few days to menstrual-like bleeding for 3-7 days. This is normal.  If your test results are not back during the visit, make an appointment with your health care provider to find out the  results. This information is not intended to replace advice given to you by your health care provider. Make sure you discuss any questions you have with your health care provider. Document Released: 03/13/2001 Document Revised: 05/12/2016 Document Reviewed: 07/04/2013 Elsevier Interactive Patient Education  2017 [employer].     Colposcopy Colposcopy is a procedure to examine the lowest part of the uterus (cervix), the  vagina, and the area around the vaginal opening (vulva) for abnormalities or signs of disease. The procedure is done using a lighted microscope or magnifying lens (colposcope). If any unusual cells are found during the procedure, your health care provider may remove a tissue sample for testing (biopsy). A colposcopy may be done if you:  Have an abnormal Pap test. A Pap test is a screening test that is used to check for signs of cancer or infection of the vagina, cervix, and uterus.  Have a Pap smear test in which you test positive for high-risk HPV (human papillomavirus).  Have a sore or lesion on your cervix.  Have genital warts on your vulva, vagina, or cervix.  Took certain medicines while pregnant, such as diethylstilbestrol (DES).  Have pain during sexual intercourse.  Have vaginal bleeding, especially after sexual intercourse.  Need to have a cervical polyp removed.  Need to have a lost intrauterine device (IUD) string located.  Let your health care provider know about:  Any allergies you have, including allergies to prescribed medicine, latex, or iodine.  All medicines you are taking, including vitamins, herbs, eye drops, creams, and over-the-counter medicines. Bring a list of all of your medicines to your appointment.  Any problems you or family members have had with anesthetic medicines.  Any blood disorders you have.  Any surgeries you have had.  Any medical conditions you have, such as pelvic inflammatory disease (PID) or endometrial disorder.  Any history of frequent fainting.  Your menstrual cycle and what form of birth control (contraception) you use.  Your medical history, including any prior cervical treatment.  Whether you are pregnant or may be pregnant. What are the risks? Generally, this is a safe procedure. However, problems may occur, including:  Pain.  Infection, which may include a fever, bad-smelling discharge, or pelvic pain.  Bleeding or  discharge.  Misdiagnosis.  Fainting and vasovagal reactions, but this is rare.  Allergic reactions to medicines.  Damage to other structures or organs.  What happens before the procedure?  If you have your menstrual period or will have it at the time of your procedure, tell your health care provider. A colposcopy typically is not done during menstruation.  Continue your contraceptive practices before and after the procedure.  For 24 hours before the colposcopy: ? Do not douche. ? Do not use tampons. ? Do not use medicines, creams, or suppositories in the vagina. ? Do not have sexual intercourse.  Ask your health care provider about: ? Changing or stopping your regular medicines. This is especially important if you are taking diabetes medicines or blood thinners. ? Taking medicines such as aspirin and ibuprofen. These medicines can thin your blood. Do not take these medicines before your procedure if your health care provider instructs you not to. It is likely that your health care provider will tell you to avoid taking aspirin or medicine that contains aspirin for 7 days before the procedure.  Follow instructions from your health care provider about eating or drinking restrictions. You will likely need to eat a regular diet the day of the procedure  and not skip any meals.  You may have an exam or testing. A pregnancy test will be taken on the day of the procedure.  You may have a blood or urine sample taken.  Plan to have someone take you home from the hospital or clinic.  If you will be going home right after the procedure, plan to have someone with you for 24 hours. What happens during the procedure?  You will lie down on your back, with your feet in foot rests (stirrups).  A warmed and lubricated instrument (speculum) will be inserted into your vagina. The speculum will be used to hold apart the walls of your vagina so your health care provider can see your cervix and the  inside of your vagina.  A cotton swab will be used to place a small amount of liquid solution on the areas to be examined. This solution makes it easier to see abnormal cells. You may feel a slight burning during this part.  The colposcope will be used to scan the cervix with a bright white light. The colposcope will be held near your vulvaand will magnify your vulva, vagina, and cervix for easier examination.  Your health care provider may decide to take a biopsy. If so: ? You may be given medicine to numb the area (local anesthetic). ? Surgical instruments will be used to suck out mucus and cells through your vagina. ? You may feel mild pain while the tissue sample is removed. ? Bleeding may occur. A solution may be used to stop the bleeding. ? If a sample of tissue is needed from the inside of the cervix, a different procedure called endocervical curettage (ECC) may be completed. During this procedure, a curved instrument (curette) will be used to scrape cells from your cervix or the top of your cervix (endocervix).  Your health care provider will record the location of any abnormalities. The procedure may vary among health care providers and hospitals. What happens after the procedure?  You will lie down and rest for a few minutes. You may be offered juice or cookies.  Your blood pressure, heart rate, breathing rate, and blood oxygen level will be monitored until any medicines you were given have worn off.  You may have to wear compression stockings. These stockings help to prevent blood clots and reduce swelling in your legs.  You may have some cramping in your abdomen. This should go away after a few minutes. This information is not intended to replace advice given to you by your health care provider. Make sure you discuss any questions you have with your health care provider. Document Released: 02/25/2003 Document Revised: 08/02/2016 Document Reviewed: 07/11/2016 Elsevier  Interactive Patient Education  [employer].

## 2018-04-26 NOTE — Progress Notes (Signed)
Pt is present today due to heavy no stop bleeding x 1 week. Pt is having big clots and using 2 pads at once and changing them every 30 minutes. Pt is tired and feeling really drain.

## 2018-04-26 NOTE — H&P (View-Only) (Signed)
GYNECOLOGY PREOPERATIVE HISTORY AND PHYSICAL   Subjective:  Christine Oneill is a 57 y.o. [id number] here for surgical management of abnormal uterine bleeding and abnormal pap smear.   Indications for procedure include: morbid obesity, abnormal uterine bleeding, inability to perform in office endometrial sampling, and HR HPV+ pap smear. No significant preoperative concerns.  Proposed surgery: Hysteroscopy D&C with colposcopy    Pertinent Gynecological History: Menses: flow is excessive with use of 2 pads every 30 minutes Bleeding: perimenopausal abnormal bleeding Contraception: None Last mammogram:Last year. Notes normal results (although no results seen in Epic) Last pap: abnormal: NILM with HR HPV+ (type 16) Date: 03/2018   Past Medical History:  Diagnosis Date  . Abnormal uterine bleeding (AUB)   . CHF (congestive heart failure) ([place])   . Hypertension   . Obstructive sleep apnea   . Pulmonary HTN ([place])    Past Surgical History:  Procedure Laterality Date  . APPENDECTOMY    . CESAREAN SECTION     OB History  Gravida Para Term Preterm AB Living  2 2 2         SAB TAB Ectopic Multiple Live Births               # Outcome Date GA Lbr Len/2nd Weight Sex Delivery Anes PTL Lv  2 Term 47    F CS-Unspec     1 Term 16    M CS-Unspec       Family History  Problem Relation Age of Onset  . Lung cancer Mother   . Kidney failure Father   . Hypertension Sister     Social History   Socioeconomic History  . Marital status: Single    Spouse name: Not on file  . Number of children: 2  . Years of education: 62  . Highest education level: 12th grade  Occupational History  . Not on file  Social Needs  . Financial resource strain: Somewhat hard  . Food insecurity:    Worry: Never true    Inability: Never true  . Transportation needs:    Medical: Yes    Non-medical: Yes  Tobacco Use  . Smoking status: Former Smoker    Packs/day: 1.00    Types: Cigarettes    Last attempt  to quit: 12/25/2017    Years since quitting: 0.3  . Smokeless tobacco: Never Used  Substance and Sexual Activity  . Alcohol use: No  . Drug use: No  . Sexual activity: Never    Birth control/protection: None  Lifestyle  . Physical activity:    Days per week: 7 days    Minutes per session: 30 min  . Stress: Not at all  Relationships  . Social connections:    Talks on phone: More than three times a week    Gets together: More than three times a week    Attends religious service: More than 4 times per year    Active member of club or organization: No    Attends meetings of clubs or organizations: Never    Relationship status: Divorced  . Intimate partner violence:    Fear of current or ex partner: Patient refused    Emotionally abused: Patient refused    Physically abused: Patient refused    Forced sexual activity: Patient refused  Other Topics Concern  . Not on file  Social History Narrative  . Not on file    Current Outpatient Medications on File Prior to Visit  Medication Sig Dispense Refill  .  albuterol (PROVENTIL HFA;VENTOLIN HFA) 108 (90 Base) MCG/ACT inhaler Inhale 2 puffs into the lungs every 6 (six) hours as needed for wheezing or shortness of breath. 1 Inhaler 2  . albuterol (PROVENTIL) (2.5 MG/3ML) 0.083% nebulizer solution Take 3 mLs (2.5 mg total) by nebulization every 6 (six) hours as needed for wheezing or shortness of breath. 75 mL 12  . aspirin EC 81 MG EC tablet Take 1 tablet (81 mg total) by mouth daily. (Patient not taking: Reported on 04/26/2018) 30 tablet 0  . furosemide (LASIX) 40 MG tablet Take 1 tablet (40 mg total) by mouth daily. 30 tablet 1  . metolazone (ZAROXOLYN) 2.5 MG tablet Take once per week, every Monday. (Patient taking differently: Take 2.5 mg by mouth every Monday. Take once per week, every Monday.) 8 tablet 1  . metoprolol succinate (TOPROL-XL) 25 MG 24 hr tablet Take 0.5 tablets (12.5 mg total) by mouth daily. Hold if SBP<105 30 tablet 0   No  current facility-administered medications on file prior to visit.    No Known Allergies   Review of Systems Constitutional: No recent fever/chills/sweats Respiratory: No recent cough/bronchitis Cardiovascular: No chest pain Gastrointestinal: No recent nausea/vomiting/diarrhea Genitourinary: No UTI symptoms Hematologic/lymphatic:No history of coagulopathy or recent blood thinner use    Objective:   Blood pressure 117/75, pulse 87, height 5\' 3"  (1.6 m), weight (!) 301 lb 11.2 oz (136.9 kg). CONSTITUTIONAL: Well-developed, well-nourished female in no acute distress.  HENT:  Normocephalic, atraumatic, External right and left ear normal. Oropharynx is clear and moist EYES: Conjunctivae and EOM are normal. Pupils are equal, round, and reactive to light. No scleral icterus.  NECK: Normal range of motion, supple, no masses SKIN: Skin is warm and dry. No rash noted. Not diaphoretic. No erythema. No pallor.  NEUROLOGIC: Alert and oriented to person, place, and time. Normal reflexes, muscle tone coordination. No cranial nerve deficit noted. PSYCHIATRIC: Normal mood and affect. Normal behavior. Normal judgment and thought content. CARDIOVASCULAR: Normal heart rate noted, regular rhythm RESPIRATORY: Effort and breath sounds normal, no problems with respiration noted ABDOMEN: Soft, nontender, nondistended. PELVIC: Deferred MUSCULOSKELETAL: Normal range of motion. No edema and no tenderness. 2+ distal pulses.    Labs: No results found for this or any previous visit (from the past 336 hour(s)).   Imaging Studies: Dg Chest 2 View  Result Date: 04/08/2018 CLINICAL DATA:  Acute onset of left-sided chest pain and shortness of breath. EXAM: CHEST - 2 VIEW COMPARISON:  Chest radiograph performed 03/18/2018 FINDINGS: The lungs are well-aerated. Mild bibasilar atelectasis is noted. Mild vascular congestion is noted. There is no evidence of pleural effusion or pneumothorax. The heart is normal in size;  the mediastinal contour is within normal limits. No acute osseous abnormalities are seen. IMPRESSION: Mild bibasilar atelectasis.  Mild vascular congestion. Electronically Signed   By: Garald Balding M.D.   On: 04/08/2018 01:23     ULTRASOUND REPORT  Location: [facility] Date of Service:  03/22/2018   Indications: PMB Findings:  The uterus measures 8.7 x 5.7 x 4.6 cm. Echo texture is heterogeneous without evidence of focal masses. No discernable fibroids were found within the uterus, however, multiple areas of calcification were noted throughout.  The uterus was very difficult to penetrate. The Endometrium was unable to be delineated.  Right Ovary measures 3.3 x 1.9 x 1.8 cm. It is normal in appearance. Left Ovary was not visualized. A solid appearing mass is noted in the right adnexa adjacent to the right ovary  measuring 2.1 x 1.8 x 1.4 cm. There is no free fluid in the cul de sac.  Impression: 1. Anteflexed uterus appears heterogeneous and is difficult to penetrate.  Multiple calcification areas noted throughout. 2. Right ovary appears WNL.  Left ovary was not visualized. 3. Solid appearing mass in the right adnexa adjacent to the right ovary measuring 2.1 x 1.8 x 1.4 cm.  Recommendations: 1.Clinical correlation with the patient's History and Physical Exam.   Dario Ave, RDMS    I have reviewed this study and agree with documented findings.    Rubie Maid, MD [facility]    Assessment:    Menorrhagia with irregular cycle  Morbid obesity ([place]) HPV infection Comorbidities (CHF, pulmonary HTN) Plan:    Counseling: Procedure, risks, reasons, benefits and complications (including injury to bowel, bladder, major blood vessel, ureter, bleeding, possibility of transfusion, infection, or fistula formation) reviewed in detail. Likelihood of success in alleviating the patient's condition was discussed. Routine postoperative instructions  will be reviewed with the patient and her family in detail after surgery.  The patient concurred with the proposed plan, giving informed written consent for the surgery.   Preop testing ordered. Instructions reviewed, including NPO after midnight.      Rubie Maid, MD [facility]

## 2018-04-27 ENCOUNTER — Other Ambulatory Visit: Payer: Self-pay

## 2018-04-27 ENCOUNTER — Other Ambulatory Visit
Admission: RE | Admit: 2018-04-27 | Discharge: 2018-04-27 | Disposition: A | Discharge status: Home / Self Care | Payer: BLUE CROSS/BLUE SHIELD | Source: Ambulatory Visit | Attending: Internal Medicine | Admitting: Internal Medicine

## 2018-04-27 ENCOUNTER — Encounter
Admission: RE | Admit: 2018-04-27 | Discharge: 2018-04-27 | Disposition: A | Discharge status: Home / Self Care | Payer: BLUE CROSS/BLUE SHIELD | Source: Ambulatory Visit | Attending: Obstetrics and Gynecology | Admitting: Obstetrics and Gynecology

## 2018-04-27 ENCOUNTER — Ambulatory Visit (INDEPENDENT_AMBULATORY_CARE_PROVIDER_SITE_OTHER): Payer: BLUE CROSS/BLUE SHIELD | Admitting: Internal Medicine

## 2018-04-27 ENCOUNTER — Encounter: Payer: Self-pay | Admitting: Internal Medicine

## 2018-04-27 VITALS — BP 100/60 | HR 82 | Resp 16 | Ht 63.0 in | Wt 303.0 lb

## 2018-04-27 DIAGNOSIS — G4733 Obstructive sleep apnea (adult) (pediatric): Secondary | ICD-10-CM

## 2018-04-27 DIAGNOSIS — Z01818 Encounter for other preprocedural examination: Secondary | ICD-10-CM | POA: Insufficient documentation

## 2018-04-27 DIAGNOSIS — N939 Abnormal uterine and vaginal bleeding, unspecified: Secondary | ICD-10-CM | POA: Insufficient documentation

## 2018-04-27 DIAGNOSIS — E662 Morbid (severe) obesity with alveolar hypoventilation: Secondary | ICD-10-CM

## 2018-04-27 DIAGNOSIS — J9611 Chronic respiratory failure with hypoxia: Secondary | ICD-10-CM

## 2018-04-27 DIAGNOSIS — I272 Pulmonary hypertension, unspecified: Secondary | ICD-10-CM

## 2018-04-27 HISTORY — DX: Unspecified asthma, uncomplicated: J45.909

## 2018-04-27 HISTORY — DX: Chronic obstructive pulmonary disease, unspecified: J44.9

## 2018-04-27 LAB — BASIC METABOLIC PANEL
Anion gap: 8 (ref 5–15)
BUN: 20 mg/dL (ref 6–20)
CHLORIDE: 103 mmol/L (ref 101–111)
CO2: 27 mmol/L (ref 22–32)
CREATININE: 0.97 mg/dL (ref 0.44–1.00)
Calcium: 8.8 mg/dL — ABNORMAL LOW (ref 8.9–10.3)
GFR calc Af Amer: >60 mL/min (ref >60)
GFR calc non Af Amer: >60 mL/min (ref >60)
Glucose, Bld: 98 mg/dL (ref 65–99)
POTASSIUM: 3.6 mmol/L (ref 3.5–5.1)
Sodium: 138 mmol/L (ref 135–145)

## 2018-04-27 LAB — CBC
HEMATOCRIT: 33.7 % — AB (ref 35.0–47.0)
Hemoglobin: 10.9 g/dL — ABNORMAL LOW (ref 12.0–16.0)
MCH: 30.4 pg (ref 26.0–34.0)
MCHC: 32.5 g/dL (ref 32.0–36.0)
MCV: 93.8 fL (ref 80.0–100.0)
Platelets: 356 10*3/uL (ref 150–440)
RBC: 3.59 MIL/uL — ABNORMAL LOW (ref 3.80–5.20)
RDW: 16.6 % — ABNORMAL HIGH (ref 11.5–14.5)
WBC: 8.2 10*3/uL (ref 3.6–11.0)

## 2018-04-27 LAB — HEMOGLOBIN AND HEMATOCRIT, BLOOD
Hematocrit: 35 % (ref 34.0–46.6)
Hemoglobin: 11.3 g/dL (ref 11.1–15.9)

## 2018-04-27 MED ORDER — METOLAZONE 2.5 MG PO TABS: ORAL_TABLET | ORAL | 1 refills | Status: DC

## 2018-04-27 NOTE — Patient Instructions (Signed)
Continue using CPAP every night.  

## 2018-04-27 NOTE — Patient Instructions (Signed)
Your procedure is scheduled on: Mon 5/13 Report to Day Surgery. To find out your arrival time please call [phone] between 1PM - 3PM on today.  Remember: Instructions that are not followed completely may result in serious medical risk, up to and including death, or upon the discretion of your surgeon and anesthesiologist your surgery may need to be rescheduled.     _X__ 1. Do not eat food after midnight the night before your procedure.                 No gum chewing or hard candies. You may drink clear liquids up to 2 hours                 before you are scheduled to arrive for your surgery- DO not drink clear                 liquids within 2 hours of the start of your surgery.                 Clear Liquids include:  water, apple juice without pulp, clear carbohydrate                 drink such as Clearfast of Gartor[place], Black Coffee or Tea (Do not add                 anything to coffee or tea).  __X__2.  On the morning of surgery brush your teeth with toothpaste and water, you may rinse your mouth with mouthwash if you wish.  Do not swallow any              toothpaste of mouthwash.     _X__ 3.  No Alcohol for 24 hours before or after surgery.   ___ 4.  Do Not Smoke or use e-cigarettes For 24 Hours Prior to Your Surgery.                 Do not use any chewable tobacco products for at least 6 hours prior to                 surgery.  ____  5.  Bring all medications with you on the day of surgery if instructed.   __x__  6.  Notify your doctor if there is any change in your medical condition      (cold, fever, infections).     Do not wear jewelry, make-up, hairpins, clips or nail polish. Do not wear lotions, powders, or perfumes. You may wear deodorant. Do not shave 48 hours prior to surgery. Men may shave face and neck. Do not bring valuables to the hospital.    [facility] is not responsible for any belongings or valuables.  Contacts, dentures or  bridgework may not be worn into surgery. Leave your suitcase in the car. After surgery it may be brought to your room. For patients admitted to the hospital, discharge time is determined by your treatment team.   Patients discharged the day of surgery will not be allowed to drive home.   Please read over the following fact sheets that you were given:    __x__ Take these medicines the morning of surgery with A SIP OF WATER:    1. metoprolol succinate (TOPROL-XL) 25 MG 24 hr tablet  2.   3.   4.  5.  6.  ____ Fleet Enema (as directed)   ____ Use CHG Soap as directed  __x__ Use inhalers on the  day of surgeryalbuterol (PROVENTIL HFA;VENTOLIN HFA) 108 (90 Base) MCG/ACT inhaler and bring with you to hospital  ____ Stop metformin 2 days prior to surgery    ____ Take 1/2 of usual insulin dose the night before surgery. No insulin the morning          of surgery.   _x___ Stopped aspirin already   __x__ Stop Anti-inflammatories naproxen sodium (ALEVE) 220 MG tablet or ibuprofen until after surgery   ____ Stop supplements until after surgery.    __x__ Bring C-Pap to the hospital.

## 2018-04-27 NOTE — Pre-Procedure Instructions (Signed)
Will see Dr. Ashby Dawes today to receive clearance for surgery on 5/13.  Form sent with patient to office.  Will fax back when exam complete

## 2018-04-27 NOTE — Addendum Note (Signed)
Addended by: Santiago Bur on: 04/27/2018 11:06 AM   Modules accepted: Orders

## 2018-04-27 NOTE — Progress Notes (Signed)
[facility] Consultation      Assessment and Plan:  Suspected obstructive sleep apnea with with obesity hypoventilation syndrome with morbid obesity. - Patient has sleep study scheduled on 1/22, start on CPAP or BiPAP based on those recommendations. --Is currently on a CPAP machine using every night.   Pulmonary edema. --Continued symptoms, will give dose of zaroxolyn x1, 2.5 mg every Monday can increase if needed, but will need to monitor potassium.   Chronic hypoxic respiratory failure. -Oxygen saturation is 89-90% at rest, she is advised that she should continue using oxygen at 2 L with ambulation particularly when leaving the home, and at night.  Is less important when she is sitting.  Pulmonary hypertension. 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 -Continue management of underlying conditions.  Preoperative pulmonary respiratory examination. - Patient appears to be optimized for surgery at this time, her respiratory status appears at baseline. - Recommend avoidance of general anesthesia possible, recommend that she be on CPAP in the peri-and postoperative periods, as well as intraoperatively if the patient is not being intubated.  Meds ordered this encounter  Medications  . metolazone (ZAROXOLYN) 2.5 MG tablet    Sig: Take once per week, every Monday.    Dispense:  8 tablet    Refill:  1   Orders Placed This Encounter  Procedures  . Basic Metabolic Panel (BMET)    Return in about 6 months (around 10/28/2018).     Date: 04/27/2018  MRN# [id number] Christine Oneill Mar 15, 1961   Christine Oneill is a 57 y.o. old female seen in consultation for chief complaint of:    Chief Complaint  Patient presents with  . Follow-up    Pt was started on zaroxolyn tablet every Monday. She says swelling is much better.  . surgery clearance    pt having surgery on next week and needs clearance.    HPI:  The patient is a 57 year old female  with  diastolic congestive heart failure/cor pulmonale, obesity hypoventilation, chronic edema and volume overload.   Since her last visit she has been doing 4 hours at work per day. She works at a [employer]. She remains on CPAP every night. She wears oxygen at 2L during the day and with CPAP at night. She feels that breathing is ok. She does not need oxygen when sitting, uses as needed when moving around.  She remain on zaroxolyn 2.5 mg once per week. Overall she feels that she has been doing well, and finds that her respiratory status is at baseline.  She has been having some uterine bleeding and has been planned for a colposcopy and hysteroscopy.  Therefore she is also here today for optimization/clearance.  chest x-ray 03/18/18, lungs are unremarkable, slightly less pulmonary edema than on previous chest x-ray on 12/25/17 which showed; findings of chronic bronchitis, pulmonary edema cardiomegaly.  Desat walk at rest on RA, sat was 94% and HR 77. Sat dropped to 86% and HR 99 after 180 feet, started on 2L oxygen and sat was 91% at rest. Ambulated addition 180 feet with mild dyspnea with oxygen 2L sat was 90% and HR 113.   Medication:    Current Outpatient Medications:  .  albuterol (PROVENTIL HFA;VENTOLIN HFA) 108 (90 Base) MCG/ACT inhaler, Inhale 2 puffs into the lungs every 6 (six) hours as needed for wheezing or shortness of breath., Disp: 1 Inhaler, Rfl: 2 .  albuterol (PROVENTIL) (2.5 MG/3ML) 0.083% nebulizer solution, Take 3 mLs (2.5 mg total)  by nebulization every 6 (six) hours as needed for wheezing or shortness of breath., Disp: 75 mL, Rfl: 12 .  aspirin EC 81 MG EC tablet, Take 1 tablet (81 mg total) by mouth daily., Disp: 30 tablet, Rfl: 0 .  Fe Cbn-Fe Gluc-FA-B12-C-DSS (FERRALET 90 PO), Take 1 tablet by mouth daily., Disp: , Rfl:  .  furosemide (LASIX) 40 MG tablet, Take 1 tablet (40 mg total) by mouth daily., Disp: 30 tablet, Rfl: 1 .  medroxyPROGESTERone  (PROVERA) 10 MG tablet, Take 2 tablets (20 mg total) by mouth daily., Disp: 30 tablet, Rfl: 2 .  metolazone (ZAROXOLYN) 2.5 MG tablet, Take once per week, every Monday. (Patient taking differently: Take 2.5 mg by mouth every Monday. Take once per week, every Monday.), Disp: 8 tablet, Rfl: 1 .  metoprolol succinate (TOPROL-XL) 25 MG 24 hr tablet, Take 0.5 tablets (12.5 mg total) by mouth daily. Hold if SBP<105, Disp: 30 tablet, Rfl: 0 .  naproxen sodium (ALEVE) 220 MG tablet, Take 440 mg by mouth 2 (two) times daily as needed (for cramping.)., Disp: , Rfl:    Allergies:  Patient has no known allergies.  Review of Systems: Gen:  Denies  fever, sweats, chills HEENT: Denies blurred vision, double vision. bleeds, sore throat Cvc:  No dizziness, chest pain. Resp:   Denies cough or sputum production, shortness of breath Gi: Denies swallowing difficulty, stomach pain. Gu:  Denies bladder incontinence, burning urine Ext:   No Joint pain, stiffness. Skin: No skin rash,  hives  Endoc:  No polyuria, polydipsia. Psych: No depression, insomnia. Other:  All other systems were reviewed with the patient and were negative other that what is mentioned in the HPI.   Physical Examination:   VS: BP 100/60 (BP Location: Left Arm, Cuff Size: Large)   Pulse 82   Resp 16   Ht 5\' 3"  (1.6 m)   Wt (!) 303 lb (137.4 kg)   LMP 04/26/2018 (Exact Date)   SpO2 100%   BMI 53.67 kg/m   General Appearance: No distress  Neuro:without focal findings,  speech normal,  HEENT: PERRLA, EOM intact.   Pulmonary: normal breath sounds, No wheezing.  CardiovascularNormal S1,S2.  No m/r/g.   Abdomen: Benign, Soft, non-tender. Renal:  No costovertebral tenderness  GU:  No performed at this time. Endoc: No evident thyromegaly, no signs of acromegaly. Skin:   warm, no rashes, no ecchymosis  Extremities: normal, no cyanosis, clubbing.  Other findings:    LABORATORY PANEL:   CBC Recent Labs  Lab 04/26/18 1107  HGB  11.3  HCT 35.0   ------------------------------------------------------------------------------------------------------------------  Chemistries  No results for input(s): NA, K, CL, CO2, GLUCOSE, BUN, CREATININE, CALCIUM, MG, AST, ALT, ALKPHOS, BILITOT in the last 168 hours.  Invalid input(s): GFRCGP ------------------------------------------------------------------------------------------------------------------  Cardiac Enzymes No results for input(s): TROPONINI in the last 168 hours. ------------------------------------------------------------  RADIOLOGY:  No results found.     Thank  you for the consultation and for allowing [facility] to assist in the care of your patient. Our recommendations are noted above.  Please contact us if we can be of further service.   Marda Stalker, MD.  Board Certified in Internal Medicine, Pulmonary Medicine, [place], and Sleep Medicine.  [facility] Office Number: [phone]  Patricia Pesa, M.D.  Merton Border, M.D  04/27/2018

## 2018-04-30 ENCOUNTER — Ambulatory Visit: Payer: BLUE CROSS/BLUE SHIELD | Admitting: Anesthesiology

## 2018-04-30 ENCOUNTER — Encounter: Payer: Self-pay | Admitting: *Deleted

## 2018-04-30 ENCOUNTER — Ambulatory Visit
Admission: RE | Admit: 2018-04-30 | Discharge: 2018-04-30 | Disposition: A | Discharge status: Home / Self Care | Payer: BLUE CROSS/BLUE SHIELD | Source: Ambulatory Visit | Attending: Obstetrics and Gynecology | Admitting: Obstetrics and Gynecology

## 2018-04-30 ENCOUNTER — Ambulatory Visit: Payer: BLUE CROSS/BLUE SHIELD | Admitting: Internal Medicine

## 2018-04-30 ENCOUNTER — Encounter
Admission: RE | Disposition: A | Discharge status: Home / Self Care | Payer: Self-pay | Source: Ambulatory Visit | Attending: Obstetrics and Gynecology

## 2018-04-30 DIAGNOSIS — I509 Heart failure, unspecified: Secondary | ICD-10-CM | POA: Insufficient documentation

## 2018-04-30 DIAGNOSIS — I272 Pulmonary hypertension, unspecified: Secondary | ICD-10-CM | POA: Insufficient documentation

## 2018-04-30 DIAGNOSIS — Z791 Long term (current) use of non-steroidal anti-inflammatories (NSAID): Secondary | ICD-10-CM | POA: Insufficient documentation

## 2018-04-30 DIAGNOSIS — N870 Mild cervical dysplasia: Secondary | ICD-10-CM | POA: Diagnosis not present

## 2018-04-30 DIAGNOSIS — G4733 Obstructive sleep apnea (adult) (pediatric): Secondary | ICD-10-CM | POA: Insufficient documentation

## 2018-04-30 DIAGNOSIS — I110 Hypertensive heart disease with heart failure: Secondary | ICD-10-CM | POA: Insufficient documentation

## 2018-04-30 DIAGNOSIS — Z9981 Dependence on supplemental oxygen: Secondary | ICD-10-CM | POA: Insufficient documentation

## 2018-04-30 DIAGNOSIS — Z6841 Body Mass Index (BMI) 40.0 and over, adult: Secondary | ICD-10-CM | POA: Diagnosis not present

## 2018-04-30 DIAGNOSIS — Z87891 Personal history of nicotine dependence: Secondary | ICD-10-CM | POA: Diagnosis not present

## 2018-04-30 DIAGNOSIS — Z79899 Other long term (current) drug therapy: Secondary | ICD-10-CM | POA: Insufficient documentation

## 2018-04-30 DIAGNOSIS — N924 Excessive bleeding in the premenopausal period: Secondary | ICD-10-CM

## 2018-04-30 DIAGNOSIS — R87810 Cervical high risk human papillomavirus (HPV) DNA test positive: Secondary | ICD-10-CM | POA: Diagnosis not present

## 2018-04-30 DIAGNOSIS — J449 Chronic obstructive pulmonary disease, unspecified: Secondary | ICD-10-CM | POA: Insufficient documentation

## 2018-04-30 DIAGNOSIS — Z7982 Long term (current) use of aspirin: Secondary | ICD-10-CM | POA: Insufficient documentation

## 2018-04-30 DIAGNOSIS — I5032 Chronic diastolic (congestive) heart failure: Secondary | ICD-10-CM

## 2018-04-30 HISTORY — PX: HYSTEROSCOPY WITH D & C: SHX1775

## 2018-04-30 HISTORY — PX: COLPOSCOPY: SHX161

## 2018-04-30 LAB — POCT PREGNANCY, URINE: PREG TEST UR: NEGATIVE

## 2018-04-30 SURGERY — DILATATION AND CURETTAGE /HYSTEROSCOPY
Anesthesia: General

## 2018-04-30 MED ORDER — MIDAZOLAM HCL 2 MG/2ML IJ SOLN
INTRAMUSCULAR | Status: AC
  Filled 2018-04-30: qty 2

## 2018-04-30 MED ORDER — LACTATED RINGERS IV SOLN
INTRAVENOUS | Status: DC
  Administered 2018-04-30 (×2): via INTRAVENOUS

## 2018-04-30 MED ORDER — MIDAZOLAM HCL 2 MG/2ML IJ SOLN
INTRAMUSCULAR | Status: DC | PRN
  Administered 2018-04-30: .5 mg via INTRAVENOUS
  Administered 2018-04-30: 1 mg via INTRAVENOUS
  Administered 2018-04-30: .5 mg via INTRAVENOUS

## 2018-04-30 MED ORDER — DEXMEDETOMIDINE HCL IN NACL 400 MCG/100ML IV SOLN
INTRAVENOUS | Status: DC | PRN
  Administered 2018-04-30: .2 ug/kg/h via INTRAVENOUS

## 2018-04-30 MED ORDER — LIDOCAINE HCL (PF) 1 % IJ SOLN
INTRAMUSCULAR | Status: DC | PRN
  Administered 2018-04-30: 10 mL

## 2018-04-30 MED ORDER — ONDANSETRON HCL 4 MG/2ML IJ SOLN: 4.0000 mg | Freq: Once | INTRAMUSCULAR | Status: DC | PRN

## 2018-04-30 MED ORDER — FAMOTIDINE 20 MG PO TABS
20.0000 mg | ORAL_TABLET | Freq: Once | ORAL | Status: AC
  Administered 2018-04-30: 20 mg via ORAL

## 2018-04-30 MED ORDER — KETOROLAC TROMETHAMINE 30 MG/ML IJ SOLN
INTRAMUSCULAR | Status: DC | PRN
  Administered 2018-04-30: 30 mg via INTRAVENOUS

## 2018-04-30 MED ORDER — PROPOFOL 10 MG/ML IV BOLUS
INTRAVENOUS | Status: AC
  Filled 2018-04-30: qty 20

## 2018-04-30 MED ORDER — ACETIC ACID 4% SOLUTION
Status: DC | PRN
  Administered 2018-04-30: 1 via TOPICAL

## 2018-04-30 MED ORDER — KETOROLAC TROMETHAMINE 30 MG/ML IJ SOLN
INTRAMUSCULAR | Status: AC
  Filled 2018-04-30: qty 1

## 2018-04-30 MED ORDER — DEXMEDETOMIDINE HCL IN NACL 80 MCG/20ML IV SOLN
INTRAVENOUS | Status: AC
  Filled 2018-04-30: qty 20

## 2018-04-30 MED ORDER — ONDANSETRON HCL 4 MG/2ML IJ SOLN
INTRAMUSCULAR | Status: AC
  Filled 2018-04-30: qty 2

## 2018-04-30 MED ORDER — FAMOTIDINE 20 MG PO TABS
ORAL_TABLET | ORAL | Status: AC
  Administered 2018-04-30: 20 mg via ORAL
  Filled 2018-04-30: qty 1

## 2018-04-30 MED ORDER — HYDROCODONE-ACETAMINOPHEN 5-325 MG PO TABS: 1.0000 | ORAL_TABLET | Freq: Four times a day (QID) | ORAL | 0 refills | Status: DC | PRN

## 2018-04-30 MED ORDER — IBUPROFEN 800 MG PO TABS: 800.0000 mg | ORAL_TABLET | Freq: Three times a day (TID) | ORAL | 1 refills | Status: DC | PRN

## 2018-04-30 MED ORDER — FENTANYL CITRATE (PF) 100 MCG/2ML IJ SOLN: 25.0000 ug | INTRAMUSCULAR | Status: DC | PRN

## 2018-04-30 MED ORDER — FENTANYL CITRATE (PF) 100 MCG/2ML IJ SOLN
INTRAMUSCULAR | Status: AC
  Filled 2018-04-30: qty 2

## 2018-04-30 MED ORDER — FERRIC SUBSULFATE 259 MG/GM EX SOLN
CUTANEOUS | Status: AC
  Filled 2018-04-30: qty 8

## 2018-04-30 MED ORDER — LIDOCAINE HCL (PF) 1 % IJ SOLN
INTRAMUSCULAR | Status: AC
  Filled 2018-04-30: qty 30

## 2018-04-30 MED ORDER — ACETIC ACID 3 % SOLN
Status: AC
  Filled 2018-04-30: qty 1000

## 2018-04-30 SURGICAL SUPPLY — 18 items
BAG INFUSER PRESSURE 100CC (MISCELLANEOUS) ×3 IMPLANT
CATH ROBINSON RED A/P 16FR (CATHETERS) ×3 IMPLANT
CUP MEDICINE 2OZ PLAST GRAD ST (MISCELLANEOUS) ×6 IMPLANT
GLOVE BIO SURGEON STRL SZ 6.5 (GLOVE) ×2 IMPLANT
GLOVE BIO SURGEONS STRL SZ 6.5 (GLOVE) ×1
GLOVE INDICATOR 7.0 STRL GRN (GLOVE) ×12 IMPLANT
GOWN STRL REUS W/ TWL LRG LVL3 (GOWN DISPOSABLE) ×2 IMPLANT
GOWN STRL REUS W/TWL LRG LVL3 (GOWN DISPOSABLE) ×4
IV LACTATED RINGERS 1000ML (IV SOLUTION) ×3 IMPLANT
KIT TURNOVER CYSTO (KITS) ×3 IMPLANT
NEEDLE SPNL 22GX3.5 QUINCKE BK (NEEDLE) ×3 IMPLANT
PACK DNC HYST (MISCELLANEOUS) ×3 IMPLANT
PAD OB MATERNITY 4.3X12.25 (PERSONAL CARE ITEMS) ×3 IMPLANT
PAD PREP 24X41 OB/GYN DISP (PERSONAL CARE ITEMS) ×3 IMPLANT
SOL PREP PVP 2OZ (MISCELLANEOUS) ×3
SOLUTION PREP PVP 2OZ (MISCELLANEOUS) ×1 IMPLANT
SURGILUBE 2OZ TUBE FLIPTOP (MISCELLANEOUS) ×3 IMPLANT
SYR CONTROL 10ML (SYRINGE) ×3 IMPLANT

## 2018-04-30 NOTE — Op Note (Signed)
Procedure(s): DILATATION AND CURETTAGE /HYSTEROSCOPY COLPOSCOPY Procedure Note  Christine Oneill female 57 y.o. 04/30/2018  Indications: The patient is a 57 y.o. G52P2000 female with morbid obesity (BMI 53), perimenopausal menorrhagia, and abnormal pap smear (NILM, HR HPV+, type 16).  Patient also with several comorbidities including CHF, pulmonary HTN.  Pre-operative Diagnosis: morbid obesity (BMI 53), perimenopausal menorrhagia, and abnormal pap smear (NILM, HR HPV+, type 16).   Post-operative Diagnosis: Same  Surgeon: Rubie Maid, MD  Assistants: None  Anesthesia: Local (10 cc of 1% Lidocaine injected into cervix)  Procedure Details: The patient was seen in the Holding Room. The risks, benefits, complications, treatment options, and expected outcomes were discussed with the patient.  The patient concurred with the proposed plan, giving informed consent.  The site of surgery properly noted/marked. The patient was taken to the Operating Room, identified as Christine Oneill and the procedure verified as Procedure(s) (LRB): DILATATION AND CURETTAGE /HYSTEROSCOPY (N/A), COLPOSCOPY (N/A).   She was placed in the dorsal lithotomy position, and was prepped and draped in a sterile manner.  A Time Out was performed and the above information was confirmed.  A sterile speculum was inserted into the vagina and the cervix was grasped at the anterior and posterior lip using a single-toothed tenaculum (due to difficulty visualizing the cervix and partially flushed cervix and redundant vaginal tissue). The cervix was viewed with the colposcope after application of acetic acid. No visible cervical lesions present. Unable to visualize cervical os and transformation zone due to redundant vaginal tissue and partial flushing of the posterior lip of the cervix and very anterior cervical positioning. An endocervical curettage was performed and sent to pathology.    Next, the uterus was sounded to 8 cm. The cervix was  injected at 2, 4, 8 and 10 o'clock positions. A 5 mm hysteroscope was introduced into the uterus under direct visualization. The cavity was allowed to fill, and then the entire cavity was explored with the findings described above. The hysteroscope was removed, and a sharp curette was then passed into the uterus and endometrial sampling was collected for pathology. Several passes were made. At the end of the procedure a straight catheterization was performed.  All instrument and sponge counts were correct at the end of the procedure x 2.  The patient tolerated the procedure well.  She was awakened and taken to the PACU in stable condition.   Findings: No visible lesions, HPV changes, mosaicism, or abnormal vasculature on colposcopy.  Unable to adequately visualized cervical os transformation zone due to cervical positioning (very anterior) posterior cervical flushing, and redundant vaginal tissue).  Uterus sounded to 8 cm.   Difficulty assessing uterine cavity due to numerous blood clots obscuring visual field, however endometrium did appear to be weakly proliferative.  Tubal ostia were not visualized bilaterally.  No intrauterine masses.   Estimated Blood Loss:  50 cc (blood clots removed during procedure).       Drains: straight catheterization prior to procedure with  50 ml of clear urine         Total IV Fluids:  400 ml  Specimens: Endocervical curettings, endometrial curettings         Implants: None         Complications:  None; patient tolerated the procedure well.         Disposition: PACU - hemodynamically stable.         Condition: stable   Rubie Maid, MD [facility]

## 2018-04-30 NOTE — Interval H&P Note (Signed)
History and Physical Interval Note:  04/30/2018 1:02 PM  Christine Oneill  has presented today for surgery, with the diagnosis of MENORRHAGIA, MORBID OBESITY, ABNORMAL PAP SMEAR.  The various methods of treatment have been discussed with the patient and family. After consideration of risks, benefits and other options for treatment, the patient has consented to  Procedure(s): DILATATION AND CURETTAGE /HYSTEROSCOPY (N/A),  COLPOSCOPY (N/A) as a surgical intervention .  The patient's history has been reviewed, patient examined, no change in status, stable for surgery.  I have reviewed the patient's chart and labs.  Questions were answered to the patient's satisfaction.     Rubie Maid, MD [facility]

## 2018-04-30 NOTE — Transfer of Care (Signed)
Immediate Anesthesia Transfer of Care Note  Patient: Christine Oneill  Procedure(s) Performed: DILATATION AND CURETTAGE /HYSTEROSCOPY (N/A ) COLPOSCOPY (N/A )  Patient Location: PACU  Anesthesia Type:MAC  Level of Consciousness: awake  Airway & Oxygen Therapy: Patient Spontanous Breathing and Patient connected to nasal cannula oxygen  Post-op Assessment: Report given to RN and Post -op Vital signs reviewed and stable  Post vital signs: Reviewed and stable  Last Vitals:  Vitals Value Taken Time  BP 110/72 04/30/2018  3:04 PM  Temp 36.5 C 04/30/2018  3:04 PM  Pulse 60 04/30/2018  3:06 PM  Resp 22 04/30/2018  3:06 PM  SpO2 98 % 04/30/2018  3:06 PM  Vitals shown include unvalidated device data.  Last Pain:  Vitals:   04/30/18 1158  TempSrc: Tympanic  PainSc: 0-No pain         Complications: No apparent anesthesia complications

## 2018-04-30 NOTE — Anesthesia Post-op Follow-up Note (Signed)
Anesthesia QCDR form completed.        

## 2018-04-30 NOTE — OR Nursing (Signed)
Discharge instructions discussed with pt and husband. Both voice understanding. 

## 2018-04-30 NOTE — Anesthesia Procedure Notes (Signed)
Procedure Name: MAC Date/Time: 04/30/2018 2:00 PM Performed by: Allean Found, CRNA Pre-anesthesia Checklist: Patient identified, Emergency Drugs available, Suction available, Patient being monitored and Timeout performed Patient Re-evaluated:Patient Re-evaluated prior to induction Oxygen Delivery Method: Nasal cannula Preoxygenation: Pre-oxygenation with 100% oxygen Induction Type: IV induction Placement Confirmation: positive ETCO2 Dental Injury: Teeth and Oropharynx as per pre-operative assessment

## 2018-04-30 NOTE — Anesthesia Preprocedure Evaluation (Addendum)
Anesthesia Evaluation  Patient identified by MRN, date of birth, ID band Patient awake    Reviewed: Allergy & Precautions, NPO status , Patient's Chart, lab work & pertinent test results  History of Anesthesia Complications Negative for: history of anesthetic complications  Airway Mallampati: II       Dental   Pulmonary asthma , sleep apnea and Continuous Positive Airway Pressure Ventilation , COPD,  COPD inhaler and oxygen dependent, former smoker,           Cardiovascular hypertension, Pt. on medications +CHF  (-) Past MI (-) dysrhythmias (-) Valvular Problems/Murmurs  Severe pulmonary HTN by echo, LVEF 60-65%   Neuro/Psych neg Seizures    GI/Hepatic Neg liver ROS, neg GERD  ,  Endo/Other  negative endocrine ROS  Renal/GU negative Renal ROS     Musculoskeletal   Abdominal   Peds  Hematology   Anesthesia Other Findings   Reproductive/Obstetrics                           Anesthesia Physical Anesthesia Plan  ASA: IV  Anesthesia Plan: General   Post-op Pain Management:    Induction: Intravenous  PONV Risk Score and Plan:   Airway Management Planned: Mask  Additional Equipment:   Intra-op Plan:   Post-operative Plan: Possible Post-op intubation/ventilation  Informed Consent: I have reviewed the patients History and Physical, chart, labs and discussed the procedure including the risks, benefits and alternatives for the proposed anesthesia with the patient or authorized representative who has indicated his/her understanding and acceptance.     Plan Discussed with:   Anesthesia Plan Comments: (We will attempt minimal sedation with versed and precedex. Will give CPAP with O2 to keep saturations up. Pt given risks and benefits and is willing to proceed.)      Anesthesia Quick Evaluation

## 2018-04-30 NOTE — Discharge Instructions (Signed)
Dilation and Curettage or Vacuum Curettage, Care After °These instructions give you information about caring for yourself after your procedure. Your doctor may also give you more specific instructions. Call your doctor if you have any problems or questions after your procedure. °Follow these instructions at home: °Activity °· Do not drive or use heavy machinery while taking prescription pain medicine. °· For 24 hours after your procedure, avoid driving. °· Take short walks often, followed by rest periods. Ask your doctor what activities are safe for you. After one or two days, you may be able to return to your normal activities. °· Do not lift anything that is heavier than 10 lb (4.5 kg) until your doctor approves. °· For at least 2 weeks, or as long as told by your doctor: °? Do not douche. °? Do not use tampons. °? Do not have sex. °General instructions °· Take over-the-counter and prescription medicines only as told by your doctor. This is very important if you take blood thinning medicine. °· Do not take baths, swim, or use a hot tub until your doctor approves. Take showers instead of baths. °· Wear compression stockings as told by your doctor. °· It is up to you to get the results of your procedure. Ask your doctor when your results will be ready. °· Keep all follow-up visits as told by your doctor. This is important. °Contact a doctor if: °· You have very bad cramps that get worse or do not get better with medicine. °· You have very bad pain in your belly (abdomen). °· You cannot drink fluids without throwing up (vomiting). °· You get pain in a different part of the area between your belly and thighs (pelvis). °· You have bad-smelling discharge from your vagina. °· You have a rash. °Get help right away if: °· You are bleeding a lot from your vagina. A lot of bleeding means soaking more than one sanitary pad in an hour, for 2 hours in a row. °· You have clumps of blood (blood clots) coming from your  vagina. °· You have a fever or chills. °· Your belly feels very tender or hard. °· You have chest pain. °· You have trouble breathing. °· You cough up blood. °· You feel dizzy. °· You feel light-headed. °· You pass out (faint). °· You have pain in your neck or shoulder area. °Summary °· Take short walks often, followed by rest periods. Ask your doctor what activities are safe for you. After one or two days, you may be able to return to your normal activities. °· Do not lift anything that is heavier than 10 lb (4.5 kg) until your doctor approves. °· Do not take baths, swim, or use a hot tub until your doctor approves. Take showers instead of baths. °· Contact your doctor if you have any symptoms of infection, like bad-smelling discharge from your vagina. °This information is not intended to replace advice given to you by your health care provider. Make sure you discuss any questions you have with your health care provider. °Document Released: 09/13/2008 Document Revised: 08/22/2016 Document Reviewed: 08/22/2016 °Elsevier Interactive Patient Education © 2017 [employer] °AMBULATORY SURGERY  °DISCHARGE INSTRUCTIONS ° ° °1) The drugs that you were given will stay in your system until tomorrow so for the next 24 hours you should not: ° °A) Drive an automobile °B) Make any legal decisions °C) Drink any alcoholic beverage ° ° °2) You may resume regular meals tomorrow.  Today it is better to start   with liquids and gradually work up to solid foods. ° °You may eat anything you prefer, but it is better to start with liquids, then soup and crackers, and gradually work up to solid foods. ° ° °3) Please notify your doctor immediately if you have any unusual bleeding, trouble breathing, redness and pain at the surgery site, drainage, fever, or pain not relieved by medication. ° ° ° °4) Additional Instructions: ° ° ° ° ° ° ° °Please contact your physician with any problems or Same Day Surgery at [phone], Monday through  Friday 6 am to 4 pm, or [place] [facility] [place] Main number at [phone]. °

## 2018-05-01 ENCOUNTER — Encounter: Payer: Self-pay | Admitting: Obstetrics and Gynecology

## 2018-05-01 NOTE — Anesthesia Postprocedure Evaluation (Signed)
Anesthesia Post Note  Patient: Christine Oneill  Procedure(s) Performed: DILATATION AND CURETTAGE /HYSTEROSCOPY (N/A ) COLPOSCOPY (N/A )  Patient location during evaluation: PACU Anesthesia Type: General Level of consciousness: awake and alert Pain management: pain level controlled Vital Signs Assessment: post-procedure vital signs reviewed and stable Respiratory status: spontaneous breathing and respiratory function stable Cardiovascular status: stable Anesthetic complications: no     Last Vitals:  Vitals:   04/30/18 1549 04/30/18 1600  BP: 112/69 121/68  Pulse: (!) 59 (!) 50  Resp: (!) 22 18  Temp: (!) 36.3 C (!) 36.4 C  SpO2: 97% 100%    Last Pain:  Vitals:   04/30/18 1600  TempSrc:   PainSc: 2                  KEPHART,WILLIAM K

## 2018-05-02 LAB — SURGICAL PATHOLOGY

## 2018-05-07 ENCOUNTER — Ambulatory Visit: Payer: BLUE CROSS/BLUE SHIELD | Admitting: Internal Medicine

## 2018-05-08 ENCOUNTER — Encounter: Payer: Self-pay | Admitting: Obstetrics and Gynecology

## 2018-05-08 ENCOUNTER — Ambulatory Visit (INDEPENDENT_AMBULATORY_CARE_PROVIDER_SITE_OTHER): Payer: BLUE CROSS/BLUE SHIELD | Admitting: Obstetrics and Gynecology

## 2018-05-08 VITALS — BP 127/80 | HR 84 | Ht 63.0 in | Wt 303.5 lb

## 2018-05-08 DIAGNOSIS — Z1211 Encounter for screening for malignant neoplasm of colon: Secondary | ICD-10-CM

## 2018-05-08 DIAGNOSIS — N939 Abnormal uterine and vaginal bleeding, unspecified: Secondary | ICD-10-CM

## 2018-05-08 DIAGNOSIS — Z9889 Other specified postprocedural states: Secondary | ICD-10-CM

## 2018-05-08 DIAGNOSIS — Z1231 Encounter for screening mammogram for malignant neoplasm of breast: Secondary | ICD-10-CM

## 2018-05-08 MED ORDER — DOCUSATE SODIUM 100 MG PO CAPS: 100.0000 mg | ORAL_CAPSULE | Freq: Two times a day (BID) | ORAL | 2 refills | Status: DC | PRN

## 2018-05-08 MED ORDER — MEGESTROL ACETATE 40 MG PO TABS: 40.0000 mg | ORAL_TABLET | Freq: Two times a day (BID) | ORAL | 3 refills | Status: DC

## 2018-05-08 MED ORDER — FERROUS SULFATE 325 (65 FE) MG PO TABS: 325.0000 mg | ORAL_TABLET | Freq: Every day | ORAL | 0 refills | Status: DC

## 2018-05-08 NOTE — Progress Notes (Signed)
Pt is present today for a post-op visit after surgery. Pt stated that she is not having any pain today. Pt stating that she is having a pink discharge. Wondering since she has stopped taking the bc if she will have cycle again.

## 2018-05-08 NOTE — Progress Notes (Signed)
    OBSTETRICS/GYNECOLOGY POST-OPERATIVE CLINIC VISIT  Subjective:     Christine Oneill is a 57 y.o. [id number] female who presents to the clinic 1 weeks status post Hysteroscopy D&C and colposcopy for abnormal uterine bleeding and abnormal pap smear. Eating a regular diet without difficulty. Bowel movements are normal. The patient is not having any pain.  Patient notes minimal bleeding. States that she had to discontinue use of the Provera tablets after 4 days due to side effects (noted leg swelling and 6-7 lb weight gain in just a few days).   The following portions of the patient's history were reviewed and updated as appropriate: allergies, current medications, past family history, past medical history, past social history, past surgical history and problem list.  Review of Systems Pertinent items noted in HPI and remainder of comprehensive ROS otherwise negative.    Objective:    BP 127/80   Pulse 84   Ht 5\' 3"  (1.6 m)   Wt (!) 303 lb 8 oz (137.7 kg)   LMP 04/26/2018 (Exact Date)   BMI 53.76 kg/m  General:  alert and no distress  Abdomen: soft, bowel sounds active, non-tender  Pelvis:   deferred    Pathology:  A. ENDOCERVIX; CURETTAGE:  - LOW-GRADE SQUAMOUS INTRAEPITHELIAL LESION (LSIL, CIN 1).  - SCANT ENDOCERVICAL CELLS.   B. ENDOMETRIUM; CURETTAGE:  - ABUNDANT BLOOD CLOT.  - NUMEROUS SQUAMOUS EPITHELIAL FRAGMENTS; NO DYSPLASIA IDENTIFIED.  - SCANT INACTIVE ENDOMETRIUM.  - SCANT ENDOCERVICAL TISSUE.  - SCANT FRAGMENTS OF SMOOTH MUSCLE.  - NEGATIVE FOR ATYPIA AND MALIGNANCY IN THIS SPECIMEN.   Assessment:    Doing well postoperatively.  Abnormal perimenopausal bleeding Morbid obesity Colon cancer screening Breast cancer screening   Plan:   1. Continue any current medications. 2. Wound care discussed.  No baths x 1 week.  3. Operative findings again reviewed. Pathology report discussed. Patient currently without any minimal bleeding. Discussed options for management  if bleeding should arise again, including progesterone (will change from Provera to Megace due to side effects), Mirena IUD placement, and surgical intervention (endometrial ablation, hysterectomy). Patient opts for Megace for now, and if bleeding persists, would like to discuss hysterectomy.  4. Activity restrictions: pelvic rest x 1 week 5. Anticipated return to work: not applicable. 6. Patient notes that she has not had a mammogram in several years.  Also has never had a colonosocpy.  Will order.  States she does have a PCP, has last been seen ~ 6 months ago 7. Follow up: if symptoms worsen on fail to prove.     Rubie Maid, MD [facility]

## 2018-05-14 NOTE — Progress Notes (Signed)
Patient ID: Christine Oneill, female    DOB: 12-Jan-1961, 57 y.o.   MRN: [id number]  HPI  Christine Oneill is a 57 y/o female with a history of pulmonary HTN, obstructive sleep apnea, HTN, previous tobacco use and chronic heart failure.   Echo report from 12/26/17 reviewed and showed an EF of 60-65% along with trivial AR and moderate TR.   Was in the ED 04/08/18 due to atypical chest pain. Labs were negative and she was released. Was in the ED 03/18/18 due to mild HF exacerbation. IV lasix given and she was released. Admitted 12/25/17 due to new onset HF. Cardiology consult obtained. Initially needed IV diuretics along with bipap. Transitioned to oral diuretics. Recommend outpatient sleep study. Discharged home with home health after 4 days.  She presents today for a follow-up visit with a chief complaint of minimal shortness of breath upon moderate exertion. She says that this has been present for several years. She has associated fatigue and left leg "buring sensation" at time. She denies any difficulty sleeping, abdominal distention, palpitations, edema, chest pain, cough, dizziness or weight gain. She says that she wears her CPAP at night. She wears her oxygen upon exertion, bedtime and PRN.   Past Medical History:  Diagnosis Date  . Abnormal uterine bleeding (AUB)   . Asthma   . CHF (congestive heart failure) ([place])   . COPD (chronic obstructive pulmonary disease) ([place])   . Hypertension   . Obstructive sleep apnea    with CPAP  . Pulmonary HTN ([place])     Past Surgical History:  Procedure Laterality Date  . APPENDECTOMY    . CESAREAN SECTION    . COLPOSCOPY N/A 04/30/2018   Procedure: COLPOSCOPY;  Surgeon: Rubie Maid, MD;  Location: [facility] ORS;  Service: Gynecology;  Laterality: N/A;  . HYSTEROSCOPY W/D&C N/A 04/30/2018   Procedure: DILATATION AND CURETTAGE /HYSTEROSCOPY;  Surgeon: Rubie Maid, MD;  Location: [facility] ORS;  Service: Gynecology;  Laterality: N/A;   Family History  Problem Relation Age of  Onset  . Lung cancer Mother   . Kidney failure Father   . Hypertension Sister    Social History   Tobacco Use  . Smoking status: Former Smoker    Packs/day: 1.00    Types: Cigarettes    Last attempt to quit: 12/25/2017    Years since quitting: 0.3  . Smokeless tobacco: Never Used  Substance Use Topics  . Alcohol use: No   No Known Allergies  Prior to Admission medications   Medication Sig Start Date End Date Taking? Authorizing Provider  albuterol (PROVENTIL HFA;VENTOLIN HFA) 108 (90 Base) MCG/ACT inhaler Inhale 2 puffs into the lungs every 6 (six) hours as needed for wheezing or shortness of breath. 12/29/17  Yes Fritzi Mandes, MD  albuterol (PROVENTIL) (2.5 MG/3ML) 0.083% nebulizer solution Take 3 mLs (2.5 mg total) by nebulization every 6 (six) hours as needed for wheezing or shortness of breath. 12/29/17  Yes Fritzi Mandes, MD  docusate sodium (COLACE) 100 MG capsule Take 1 capsule (100 mg total) by mouth 2 (two) times daily as needed. 05/08/18  Yes Rubie Maid, MD  furosemide (LASIX) 40 MG tablet Take 1 tablet (40 mg total) by mouth daily. 12/29/17  Yes Fritzi Mandes, MD  HYDROcodone-acetaminophen (NORCO/VICODIN) 5-325 MG tablet Take 1 tablet by mouth every 6 (six) hours as needed for severe pain. 04/30/18  Yes Rubie Maid, MD  ibuprofen (ADVIL,MOTRIN) 800 MG tablet Take 1 tablet (800 mg total) by mouth every 8 (eight) hours  as needed for mild pain or moderate pain. 04/30/18  Yes Rubie Maid, MD  megestrol (MEGACE) 40 MG tablet Take 1 tablet (40 mg total) by mouth 2 (two) times daily. Take one tablet three times a day for 3 days, then one tablet twice daily for 3 days, then one tablet daily Patient taking differently: Take 40 mg by mouth 2 (two) times daily. Take one tablet three times a day for 3 days, then one tablet twice daily for 3 days, then one tablet daily Pt hasnt picked up yet 05/08/18  Yes Rubie Maid, MD  metolazone (ZAROXOLYN) 2.5 MG tablet Take once per week, every Monday.  04/27/18  Yes Laverle Hobby, MD  metoprolol succinate (TOPROL-XL) 25 MG 24 hr tablet Take 0.5 tablets (12.5 mg total) by mouth daily. Hold if SBP<105 12/30/17  Yes Fritzi Mandes, MD    Review of Systems  Constitutional: Positive for fatigue. Negative for appetite change.  HENT: Negative for congestion, postnasal drip and sore throat.   Eyes: Negative.   Respiratory: Positive for shortness of breath (going up steps; walking long distances). Negative for cough and chest tightness.   Cardiovascular: Negative for chest pain, palpitations and leg swelling.  Gastrointestinal: Negative for abdominal distention and abdominal pain.  Endocrine: Negative.   Genitourinary: Negative.   Musculoskeletal: Positive for arthralgias (left upper leg burning). Negative for back pain and neck pain.  Skin: Negative.   Allergic/Immunologic: Negative.   Neurological: Negative for dizziness and light-headedness.  Hematological: Negative for adenopathy. Does not bruise/bleed easily.  Psychiatric/Behavioral: Negative for dysphoric mood and sleep disturbance (wearing oxygen on CPAP). The patient is not nervous/anxious.    Vitals:   05/15/18 0828  BP: 117/73  Pulse: 82  Resp: 18  SpO2: 100%  Weight: (!) 300 lb 2 oz (136.1 kg)  Height: 5\' 3"  (1.6 m)   Wt Readings from Last 3 Encounters:  05/15/18 (!) 300 lb 2 oz (136.1 kg)  05/08/18 (!) 303 lb 8 oz (137.7 kg)  04/30/18 (!) 303 lb (137.4 kg)   Lab Results  Component Value Date   CREATININE 0.97 04/27/2018   CREATININE 1.28 (H) 04/08/2018   CREATININE 0.82 03/18/2018    Physical Exam  Constitutional: She is oriented to person, place, and time. She appears well-developed and well-nourished.  HENT:  Head: Normocephalic and atraumatic.  Neck: Normal range of motion. Neck supple. No JVD present.  Cardiovascular: Normal rate and regular rhythm.  Pulmonary/Chest: Effort normal. She has no wheezes. She has no rales.  Abdominal: Soft. She exhibits no  distension. There is no tenderness.  Musculoskeletal: She exhibits no edema or tenderness.  Neurological: She is alert and oriented to person, place, and time.  Skin: Skin is warm and dry.  Psychiatric: She has a normal mood and affect. Her behavior is normal. Thought content normal.  Nursing note and vitals reviewed.  Assessment & Plan:  1: Chronic heart failure with preserved ejection fraction- - NYHA class II - euvolemic today - weighing daily and she has noticed a continual weight loss. Reminded to call for an overnight weight gain of >2 pounds or a weekly weight gain of >5 pounds - weight down 11 pounds since she was last here 02/15/18 - not adding salt and has been reading food labels for sodium content. Reminded to keep daily sodium intake to 2000mg . - has been walking more - has been trying to keep daily fluid intake to closer to 60 ounces - BNP on 04/08/18 was 23.0 - saw cardiology (  Nehemiah Massed) 04/03/18 - PharmD reconciled medications with the patient   2: HTN- - BP looks good today - sees PCP at [facility])  - BMP on 04/27/18 reviewed and showed sodium 138, potassium 3.6 and GFR >60  3: Obstructive sleep apnea- - wearing CPAP nightly  - wearing oxygen at 2L upon exertion, bedtime and PRN at rest - saw pulmonologist Ashby Dawes) 04/27/18 - no longer smoking  4: Lymphedema- - stage 2 - no edema present - should edema return, could consider lymphapress compression boots at that time  Patient did not bring her medications nor a list. Each medication was verbally reviewed with the patient and she was encouraged to bring the bottles to every visit to confirm accuracy of list.  Return in 6 months or sooner for any questions/problems before then.

## 2018-05-15 ENCOUNTER — Ambulatory Visit: Payer: BLUE CROSS/BLUE SHIELD | Attending: Family | Admitting: Family

## 2018-05-15 ENCOUNTER — Encounter: Payer: Self-pay | Admitting: Family

## 2018-05-15 VITALS — BP 117/73 | HR 82 | Resp 18 | Ht 63.0 in | Wt 300.1 lb

## 2018-05-15 DIAGNOSIS — G4733 Obstructive sleep apnea (adult) (pediatric): Secondary | ICD-10-CM | POA: Diagnosis not present

## 2018-05-15 DIAGNOSIS — I5032 Chronic diastolic (congestive) heart failure: Secondary | ICD-10-CM | POA: Insufficient documentation

## 2018-05-15 DIAGNOSIS — Z79899 Other long term (current) drug therapy: Secondary | ICD-10-CM | POA: Diagnosis not present

## 2018-05-15 DIAGNOSIS — I890 Lymphedema, not elsewhere classified: Secondary | ICD-10-CM | POA: Diagnosis not present

## 2018-05-15 DIAGNOSIS — J449 Chronic obstructive pulmonary disease, unspecified: Secondary | ICD-10-CM | POA: Insufficient documentation

## 2018-05-15 DIAGNOSIS — I272 Pulmonary hypertension, unspecified: Secondary | ICD-10-CM | POA: Insufficient documentation

## 2018-05-15 DIAGNOSIS — I110 Hypertensive heart disease with heart failure: Secondary | ICD-10-CM | POA: Insufficient documentation

## 2018-05-15 DIAGNOSIS — I10 Essential (primary) hypertension: Secondary | ICD-10-CM

## 2018-05-15 DIAGNOSIS — Z87891 Personal history of nicotine dependence: Secondary | ICD-10-CM | POA: Diagnosis not present

## 2018-05-15 NOTE — Patient Instructions (Signed)
Continue weighing daily and call for an overnight weight gain of > 2 pounds or a weekly weight gain of >5 pounds. 

## 2018-05-17 ENCOUNTER — Encounter

## 2018-05-17 ENCOUNTER — Encounter: Payer: BLUE CROSS/BLUE SHIELD | Admitting: Obstetrics and Gynecology

## 2018-06-06 ENCOUNTER — Encounter: Payer: BLUE CROSS/BLUE SHIELD | Admitting: Obstetrics and Gynecology

## 2018-06-16 ENCOUNTER — Other Ambulatory Visit: Payer: Self-pay | Admitting: Obstetrics and Gynecology

## 2018-06-17 ENCOUNTER — Other Ambulatory Visit: Payer: Self-pay | Admitting: Obstetrics and Gynecology

## 2018-06-19 NOTE — Telephone Encounter (Signed)
Please see how patient has been doing on medication since her surgery to see if she still requires a refill.

## 2018-06-20 ENCOUNTER — Other Ambulatory Visit: Payer: Self-pay

## 2018-06-20 ENCOUNTER — Ambulatory Visit: Payer: BLUE CROSS/BLUE SHIELD | Admitting: Obstetrics and Gynecology

## 2018-06-21 LAB — HEMOGLOBIN AND HEMATOCRIT, BLOOD
HEMOGLOBIN: 13.2 g/dL (ref 11.1–15.9)
Hematocrit: 40.7 % (ref 34.0–46.6)

## 2018-07-09 ENCOUNTER — Telehealth: Payer: Self-pay | Admitting: Internal Medicine

## 2018-07-09 MED ORDER — METOLAZONE 2.5 MG PO TABS: ORAL_TABLET | ORAL | 2 refills | Status: DC

## 2018-07-09 NOTE — Telephone Encounter (Signed)
°*  STAT* If patient is at the pharmacy, call can be transferred to refill team.   1. Which medications need to be refilled? (please list name of each medication and dose if known) Lasix 40 mg po q day   2. Which pharmacy/location (including street and city if local pharmacy) is medication to be sent to?*[facility] [address] [place]   3. Do they need a 30 day or 90 day supply? [place]

## 2018-07-09 NOTE — Telephone Encounter (Signed)
Can resume zaroxolyn 2.5 mg every Monday morning (once weekly) for 1 month with 2 refills.

## 2018-07-09 NOTE — Telephone Encounter (Signed)
RX sent

## 2018-07-09 NOTE — Telephone Encounter (Signed)
Pt is asking if you will refill the Zaroxolyn.

## 2018-08-09 ENCOUNTER — Other Ambulatory Visit: Payer: Self-pay | Admitting: Internal Medicine

## 2018-08-09 MED ORDER — METOLAZONE 2.5 MG PO TABS: ORAL_TABLET | ORAL | 2 refills | Status: DC

## 2018-08-09 NOTE — Telephone Encounter (Signed)
°*  STAT* If patient is at the pharmacy, call can be transferred to refill team.   1. Which medications need to be refilled? (please list name of each medication and dose if known) metolazone (ZAROXOLYN) 2.5 MG - 1 per week  2. Which pharmacy/location (including street and city if local pharmacy) [facility] [place]  3. Do they need a 30 day or 90 day supply? 90 day

## 2018-08-14 IMAGING — CR DG CHEST 2V
1 series · 2 of 2 positions shown · non-contrast
Comparison: None

CLINICAL DATA: Nonsmoker.  Recent diagnosis of CHF.  Swelling

EXAM:
CHEST - 2 VIEW

[Series 1: dg chest 2 view · 0.14mm/px · 2 of 2 slices shown]
[im 1/2]
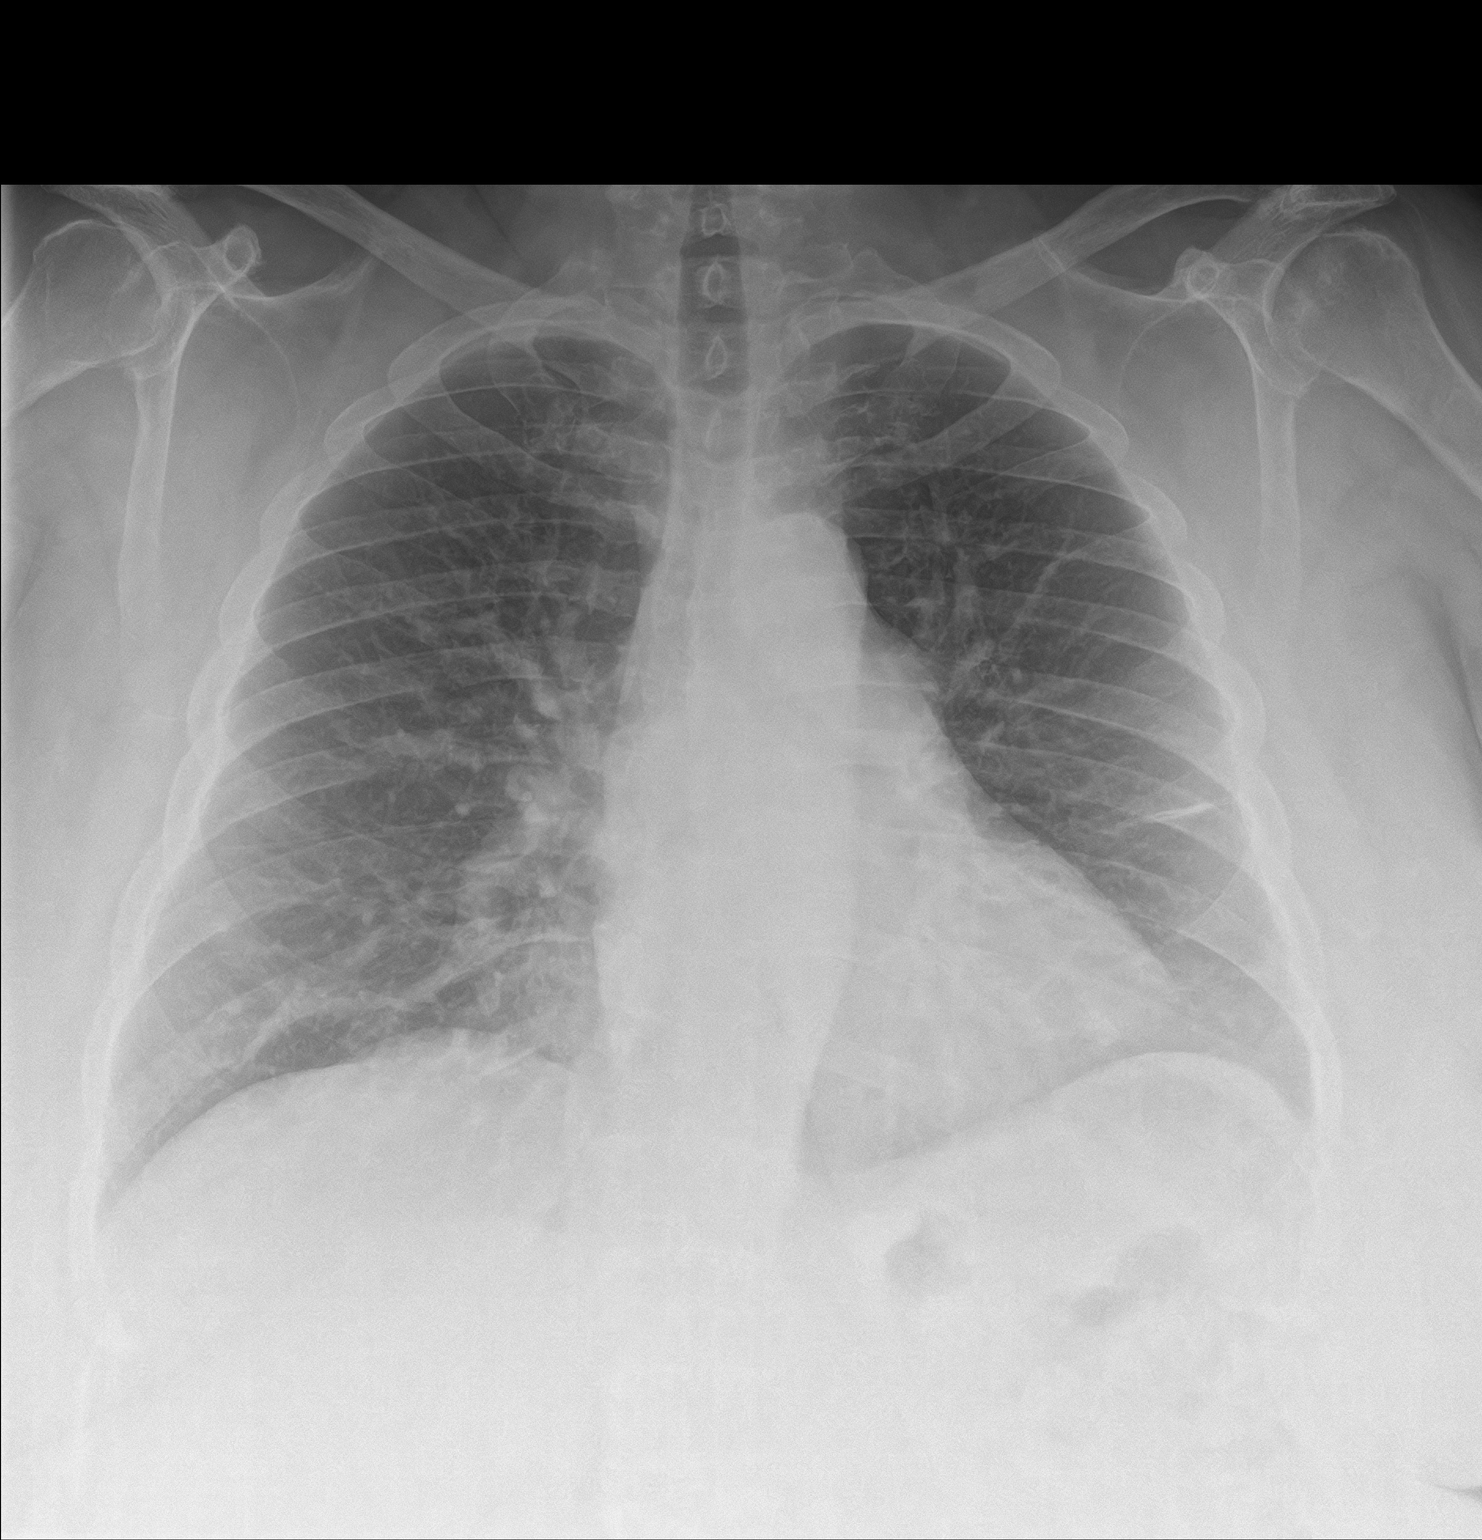
[im 2/2]
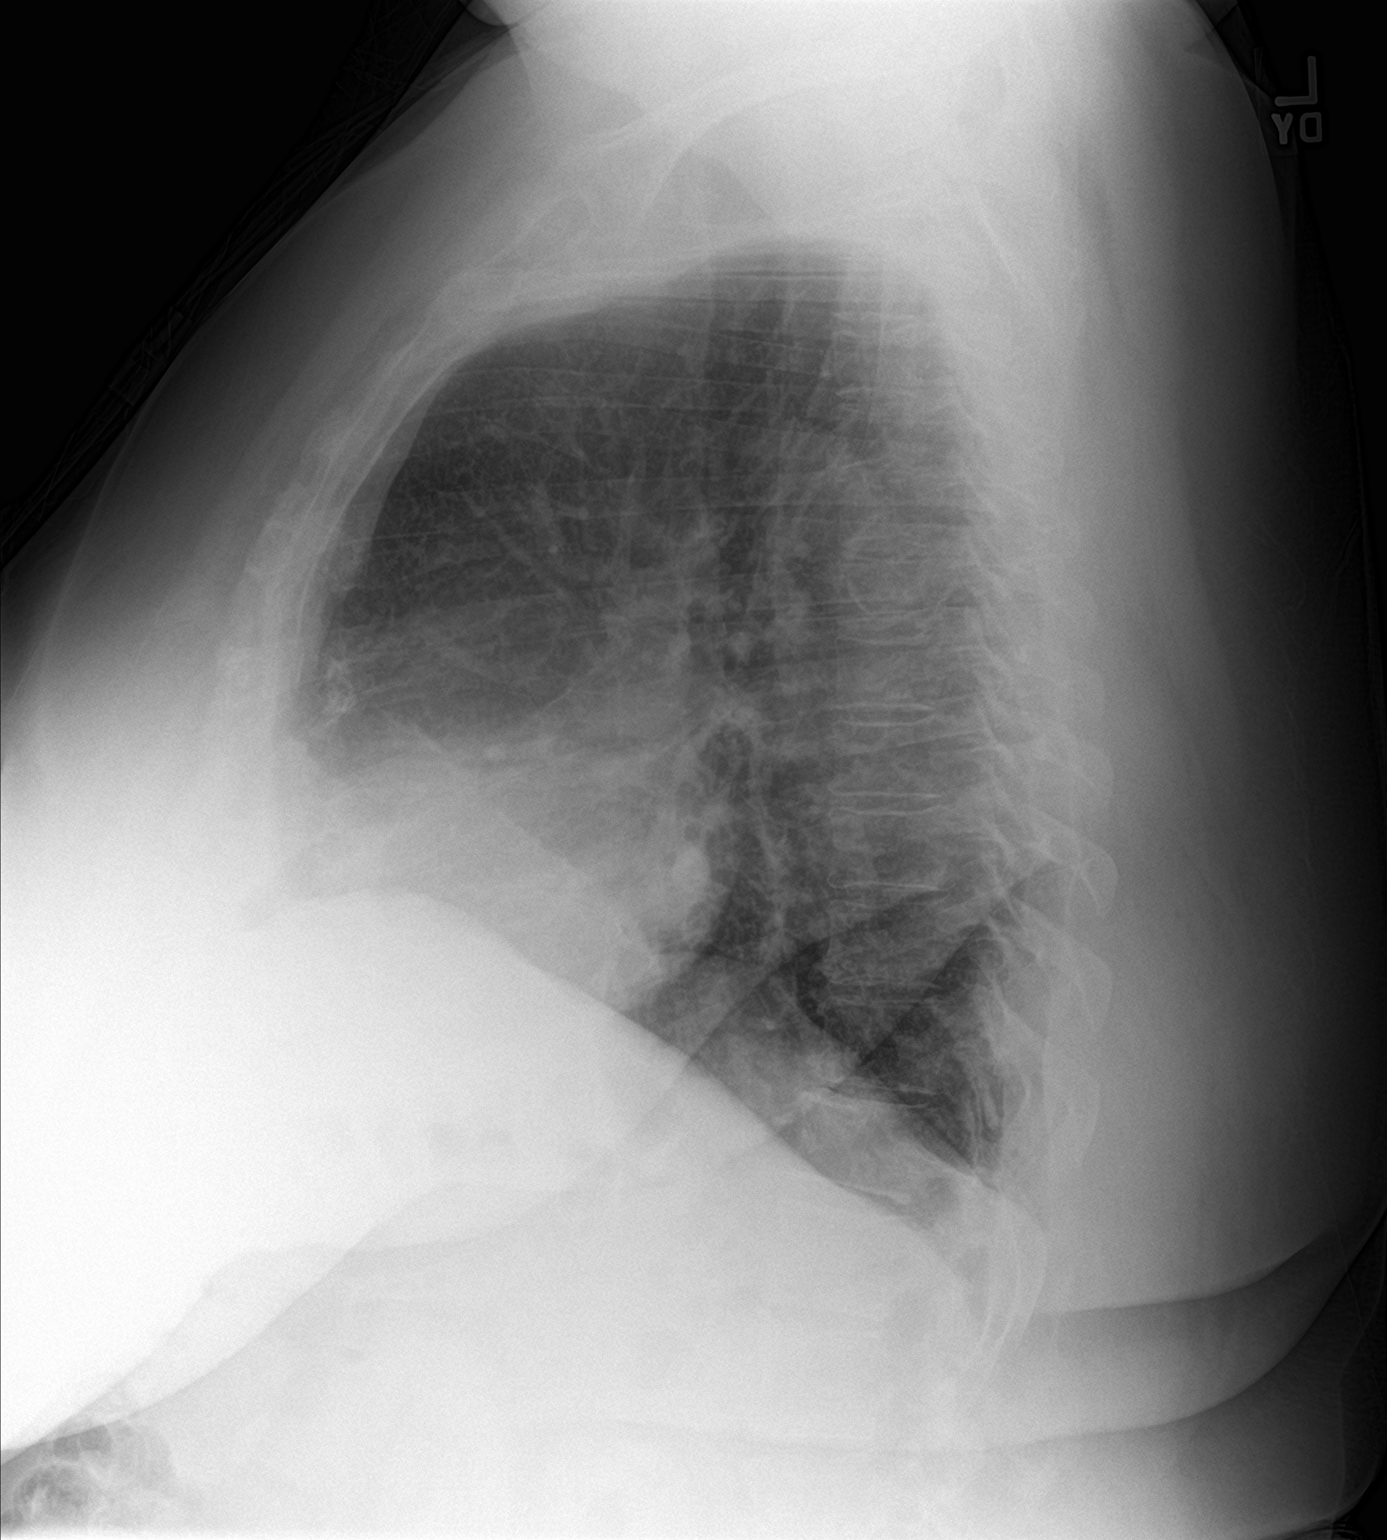

[2 of 2 positions shown; findings below may reference images not displayed]

FINDINGS: Normal heart size. No pleural effusion or edema. No airspace
opacities identified. The visualized osseous structures are
unremarkable.
IMPRESSION: 1. No evidence for CHF.

## 2018-09-15 ENCOUNTER — Other Ambulatory Visit: Payer: Self-pay | Admitting: Obstetrics and Gynecology

## 2018-09-21 ENCOUNTER — Encounter: Payer: BLUE CROSS/BLUE SHIELD | Admitting: Obstetrics and Gynecology

## 2018-10-08 ENCOUNTER — Other Ambulatory Visit: Payer: Self-pay | Admitting: Obstetrics and Gynecology

## 2018-10-25 ENCOUNTER — Encounter: Payer: BLUE CROSS/BLUE SHIELD | Admitting: Obstetrics and Gynecology

## 2018-11-08 NOTE — Progress Notes (Deleted)
Patient ID: Christine Oneill, female    DOB: [birth year], 57 y.o.   MRN: [id number]  HPI  Ms Christine Oneill is a 57 y/o female with a history of pulmonary HTN, obstructive sleep apnea, HTN, previous tobacco use and chronic heart failure.   Echo report from 12/26/17 reviewed and showed an EF of 60-65% along with trivial AR and moderate TR.   Has not been admitted or been in the ED in the last 6 months.   She presents today for a follow-up visit with a chief complaint of   Past Medical History:  Diagnosis Date  . Abnormal uterine bleeding (AUB)   . Asthma   . CHF (congestive heart failure) ([place])   . COPD (chronic obstructive pulmonary disease) ([place])   . Hypertension   . Obstructive sleep apnea    with CPAP  . Pulmonary HTN ([place])     Past Surgical History:  Procedure Laterality Date  . APPENDECTOMY    . CESAREAN SECTION    . COLPOSCOPY N/A 04/30/2018   Procedure: COLPOSCOPY;  Surgeon: Rubie Maid, MD;  Location: [facility] ORS;  Service: Gynecology;  Laterality: N/A;  . HYSTEROSCOPY W/D&C N/A 04/30/2018   Procedure: DILATATION AND CURETTAGE /HYSTEROSCOPY;  Surgeon: Rubie Maid, MD;  Location: [facility] ORS;  Service: Gynecology;  Laterality: N/A;   Family History  Problem Relation Age of Onset  . Lung cancer Mother   . Kidney failure Father   . Hypertension Sister    Social History   Tobacco Use  . Smoking status: Former Smoker    Packs/day: 1.00    Types: Cigarettes    Last attempt to quit: 12/25/2017    Years since quitting: 0.8  . Smokeless tobacco: Never Used  Substance Use Topics  . Alcohol use: No   No Known Allergies    Review of Systems  Constitutional: Positive for fatigue. Negative for appetite change.  HENT: Negative for congestion, postnasal drip and sore throat.   Eyes: Negative.   Respiratory: Positive for shortness of breath (going up steps; walking long distances). Negative for cough and chest tightness.   Cardiovascular: Negative for chest pain, palpitations and leg  swelling.  Gastrointestinal: Negative for abdominal distention and abdominal pain.  Endocrine: Negative.   Genitourinary: Negative.   Musculoskeletal: Positive for arthralgias (left upper leg burning). Negative for back pain and neck pain.  Skin: Negative.   Allergic/Immunologic: Negative.   Neurological: Negative for dizziness and light-headedness.  Hematological: Negative for adenopathy. Does not bruise/bleed easily.  Psychiatric/Behavioral: Negative for dysphoric mood and sleep disturbance (wearing oxygen on CPAP). The patient is not nervous/anxious.      Physical Exam  Constitutional: She is oriented to person, place, and time. She appears well-developed and well-nourished.  HENT:  Head: Normocephalic and atraumatic.  Neck: Normal range of motion. Neck supple. No JVD present.  Cardiovascular: Normal rate and regular rhythm.  Pulmonary/Chest: Effort normal. She has no wheezes. She has no rales.  Abdominal: Soft. She exhibits no distension. There is no tenderness.  Musculoskeletal: She exhibits no edema or tenderness.  Neurological: She is alert and oriented to person, place, and time.  Skin: Skin is warm and dry.  Psychiatric: She has a normal mood and affect. Her behavior is normal. Thought content normal.  Nursing note and vitals reviewed.  Assessment & Plan:  1: Chronic heart failure with preserved ejection fraction- - NYHA class II - euvolemic today - weighing daily and she has noticed a continual weight loss. Reminded to call for an  overnight weight gain of >2 pounds or a weekly weight gain of >5 pounds - weight  - not adding salt and has been reading food labels for sodium content. Reminded to keep daily sodium intake to 2000mg . - has been walking more - has been trying to keep daily fluid intake to closer to 60 ounces - BNP on 04/08/18 was 23.0 - saw cardiology Nehemiah Massed) 04/03/18 - PharmD reconciled medications with the patient   2: HTN- - BP  - sees PCP at  [facility])  - BMP on 04/27/18 reviewed and showed sodium 138, potassium 3.6 and GFR >60  3: Obstructive sleep apnea- - wearing CPAP nightly  - wearing oxygen at 2L upon exertion, bedtime and PRN at rest - saw pulmonologist Ashby Dawes) 04/27/18 - no longer smoking  4: Lymphedema- - stage 2 - no edema present - should edema return, could consider lymphapress compression boots at that time  Patient did not bring her medications nor a list. Each medication was verbally reviewed with the patient and she was encouraged to bring the bottles to every visit to confirm accuracy of list.

## 2018-11-12 ENCOUNTER — Ambulatory Visit: Payer: BLUE CROSS/BLUE SHIELD | Admitting: Family

## 2018-12-17 ENCOUNTER — Encounter: Payer: Self-pay | Admitting: Family

## 2018-12-17 ENCOUNTER — Ambulatory Visit: Payer: BLUE CROSS/BLUE SHIELD | Attending: Family | Admitting: Family

## 2018-12-17 VITALS — BP 135/84 | HR 88 | Resp 18 | Ht 63.0 in | Wt 311.0 lb

## 2018-12-17 DIAGNOSIS — J449 Chronic obstructive pulmonary disease, unspecified: Secondary | ICD-10-CM | POA: Diagnosis not present

## 2018-12-17 DIAGNOSIS — Z8249 Family history of ischemic heart disease and other diseases of the circulatory system: Secondary | ICD-10-CM | POA: Diagnosis not present

## 2018-12-17 DIAGNOSIS — I890 Lymphedema, not elsewhere classified: Secondary | ICD-10-CM | POA: Diagnosis not present

## 2018-12-17 DIAGNOSIS — I10 Essential (primary) hypertension: Secondary | ICD-10-CM

## 2018-12-17 DIAGNOSIS — Z79899 Other long term (current) drug therapy: Secondary | ICD-10-CM | POA: Diagnosis not present

## 2018-12-17 DIAGNOSIS — I5032 Chronic diastolic (congestive) heart failure: Secondary | ICD-10-CM | POA: Diagnosis present

## 2018-12-17 DIAGNOSIS — I272 Pulmonary hypertension, unspecified: Secondary | ICD-10-CM | POA: Diagnosis not present

## 2018-12-17 DIAGNOSIS — G4733 Obstructive sleep apnea (adult) (pediatric): Secondary | ICD-10-CM | POA: Insufficient documentation

## 2018-12-17 DIAGNOSIS — Z791 Long term (current) use of non-steroidal anti-inflammatories (NSAID): Secondary | ICD-10-CM | POA: Diagnosis not present

## 2018-12-17 DIAGNOSIS — Z87891 Personal history of nicotine dependence: Secondary | ICD-10-CM | POA: Insufficient documentation

## 2018-12-17 DIAGNOSIS — I110 Hypertensive heart disease with heart failure: Secondary | ICD-10-CM | POA: Insufficient documentation

## 2018-12-17 NOTE — Patient Instructions (Signed)
Continue weighing daily and call for an overnight weight gain of > 2 pounds or a weekly weight gain of >5 pounds. 

## 2018-12-17 NOTE — Progress Notes (Signed)
[facility] - PHARMACIST COUNSELING NOTE   ASSESSMENT   (Brief summary of the presentation/problems identified): SOB after walking long distances, fluid build up on the left side but not right.    Adherence assessment   Patient is using pill bottle as an adherence strategy.   Do you ever forget to take your medication? [] Yes (1) [x] No (0)  Do you ever skip doses due to side effects? [] Yes (1) [x] No (0)  Do you have trouble affording your medicines? [] Yes (1) [x] No (0)  Are you ever unable to pick up your medication due to transportation difficulties? [] Yes (1) [x] No (0)  Do you ever stop taking your medications because you don't believe they are helping? [] Yes (1) [x] No (0)  Total score _0______      Guideline-Directed Medical Therapy/Evidence Based Medicine   ACE/ARB/ARNI: none   Beta Blocker: metoprolol succincate 12.5 mg daily    Aldosterone Antagonist: none Diuretic: lasix 40 mg daily     PLAN Recommend continuing current regimen. No changes needed from a medication stand point. Takes IBU - consulted that APAP is the preferred pain medication but pt states it does not work well for her.    SUBJECTIVE   HPI: follow up appointment    Past Medical History:  Diagnosis Date  . Abnormal uterine bleeding (AUB)   . Asthma   . CHF (congestive heart failure) ([place])   . COPD (chronic obstructive pulmonary disease) ([place])   . Hypertension   . Obstructive sleep apnea    with CPAP  . Pulmonary HTN (HCC)       Current Outpatient Medications:  .  albuterol (PROVENTIL HFA;VENTOLIN HFA) 108 (90 Base) MCG/ACT inhaler, Inhale 2 puffs into the lungs every 6 (six) hours as needed for wheezing or shortness of breath., Disp: 1 Inhaler, Rfl: 2 .  albuterol (PROVENTIL) (2.5 MG/3ML) 0.083% nebulizer solution, Take 3 mLs (2.5 mg total) by nebulization every 6 (six) hours as needed for wheezing or shortness of breath. (Patient not taking:  Reported on 12/17/2018), Disp: 75 mL, Rfl: 12 .  docusate sodium (COLACE) 100 MG capsule, Take 1 capsule (100 mg total) by mouth 2 (two) times daily as needed. (Patient not taking: Reported on 12/17/2018), Disp: 30 capsule, Rfl: 2 .  furosemide (LASIX) 40 MG tablet, Take 1 tablet (40 mg total) by mouth daily., Disp: 30 tablet, Rfl: 1 .  HYDROcodone-acetaminophen (NORCO/VICODIN) 5-325 MG tablet, Take 1 tablet by mouth every 6 (six) hours as needed for severe pain., Disp: 10 tablet, Rfl: 0 .  ibuprofen (ADVIL,MOTRIN) 800 MG tablet, Take 1 tablet (800 mg total) by mouth every 8 (eight) hours as needed for mild pain or moderate pain., Disp: 60 tablet, Rfl: 1 .  megestrol (MEGACE) 40 MG tablet, Take 1 tablet (40 mg total) by mouth 2 (two) times daily. Take one tablet three times a day for 3 days, then one tablet twice daily for 3 days, then one tablet daily (Patient not taking: Reported on 12/17/2018), Disp: 90 tablet, Rfl: 3 .  metolazone (ZAROXOLYN) 2.5 MG tablet, Take once per week, every Monday., Disp: 8 tablet, Rfl: 2 .  metoprolol succinate (TOPROL-XL) 25 MG 24 hr tablet, Take 0.5 tablets (12.5 mg total) by mouth daily. Hold if SBP<105, Disp: 30 tablet, Rfl: 0    OBJECTIVE    BMP Latest Ref Rng & Units 04/27/2018 04/08/2018 03/18/2018  Glucose 65 - 99 mg/dL 98 110(H) 107(H)  BUN 6 - 20 mg/dL 20 19  19  Creatinine 0.44 - 1.00 mg/dL 0.97 1.28(H) 0.82  Sodium 135 - 145 mmol/L 138 137 138  Potassium 3.5 - 5.1 mmol/L 3.6 3.8 4.1  Chloride 101 - 111 mmol/L 103 103 106  CO2 22 - 32 mmol/L 27 26 23   Calcium 8.9 - 10.3 mg/dL 8.8(L) 8.7(L) 9.1    Vital signs: HR 88, BP 135/70,   PE: Deferred  ECHO: Date 12/2017, EF 60-65%   DRUGS TO AVOID IN HEART FAILURE  Drug or Class Mechanism  Analgesics . NSAIDs . COX-2 inhibitors . Glucocorticoids  Sodium and water retention, increased systemic vascular resistance, decreased response to diuretics   Diabetes  Medications . Metformin . Thiazolidinediones o Rosiglitazone (Avandia) o Pioglitazone (Actos) . DPP4 Inhibitors o Saxagliptin (Onglyza) o Sitagliptin (Januvia)   Lactic acidosis Possible calcium channel blockade   Unknown  Antiarrhythmics . Class I  o Flecainide o Disopyramide . Class III o Sotalol . Other o Dronedarone  Negative inotrope, proarrhythmic   Proarrhythmic, beta blockade  Negative inotrope  Antihypertensives . Alpha Blockers o Doxazosin . Calcium Channel Blockers o Diltiazem o Verapamil o Nifedipine . Central Alpha Adrenergics o Moxonidine . Peripheral Vasodilators o Minoxidil  Increases renin and aldosterone  Negative inotrope    Possible sympathetic withdrawal  Unknown  Anti-infective . Itraconazole . Amphotericin B  Negative inotrope Unknown  Hematologic . Anagrelide . Cilostazol   Possible inhibition of PD IV Inhibition of PD III causing arrhythmias  Neurologic/Psychiatric . Stimulants . Anti-Seizure Drugs o Carbamazepine o Pregabalin . Antidepressants o Tricyclics o Citalopram . Parkinsons o Bromocriptine o Pergolide o Pramipexole . Antipsychotics o Clozapine . Antimigraine o Ergotamine o Methysergide . Appetite suppressants . Bipolar o Lithium  Peripheral alpha and beta agonist activity  Negative inotrope and chronotrope Calcium channel blockade  Negative inotrope, proarrhythmic Dose-dependent QT prolongation  Excessive serotonin activity/valvular damage Excessive serotonin activity/valvular damage Unknown  IgE mediated hypersensitivy, calcium channel blockade  Excessive serotonin activity/valvular damage Excessive serotonin activity/valvular damage Valvular damage  Direct myofibrillar degeneration, adrenergic stimulation  Antimalarials . Chloroquine . Hydroxychloroquine Intracellular inhibition of lysosomal enzymes  Urologic Agents . Alpha  Blockers o Doxazosin o Prazosin o Tamsulosin o Terazosin  Increased renin and aldosterone  Adapted from Page RL, et al. "Drugs That May Cause or Exacerbate Heart Failure: A Scientific Statement from the [place]." Circulation 2016; 694:W54-O27. DOI: 10.1161/CIR.0000000000000426   COUNSELING POINTS/CLINICAL PEARLS  Metoprolol Succinate   Warn patient to avoid activities requiring mental alertness or coordination until drug effects  are realized, as drug may cause dizziness. Tell patient planning major surgery with anesthesia to alert physician that drug is being  used, as drug impairs ability of heart to respond to reflex adrenergic stimuli. Drug may cause diarrhea, fatigue, headache, or depression. Advise diabetic patient to carefully monitor blood glucose as drug may mask symptoms of hypoglycemia. Patient should take extended-release tablet with or immediately following meals. Counsel patient against sudden discontinuation of drug, as this may precipitate hypertension, angina, or myocardial infarction. In the event of a missed dose, counsel patient to skip the missed dose and maintain a regular dosing schedule. Furosemide  Drug causes sun-sensitivity. Advise patient to use sunscreen and avoid tanning beds. Patient should avoid activities requiring coordination until drug effects are realized, as drug  may cause dizziness, vertigo, or blurred vision. This drug may cause hyperglycemia, hyperuricemia, constipation, diarrhea, loss of appetite, nausea, vomiting, purpuric disorder, cramps, spasticity, asthenia, headache, paresthesia, or scaling eczema. Instruct patient to report unusual bleeding/bruising or  signs/symptoms of hypotension,  infection, pancreatitis, or ototoxicity (tinnitus, hearing impairment). Advise patient to report signs/symptoms of a severe skin reactions (flu-like symptoms,  spreading red rash, or skin/mucous membrane blistering) or erythema  multiforme. Instruct patient to eat high-potassium foods during drug therapy, as directed by healthcare  professional.  Patient should not drink alcohol while taking this drug.  MEDICATION ADHERENCES TIPS AND STRATEGIES 1. Taking medication as prescribed improves patient outcomes in heart failure (reduces hospitalizations, improves symptoms, increases survival) 2. Side effects of medications can be managed by decreasing doses, switching agents, stopping drugs, or adding additional therapy. Please let someone in the [facility] know if you have having bothersome side effects so we can modify your regimen. Do not alter your medication regimen without talking to [place].  3. Medication reminders can help patients remember to take drugs on time. If you are missing or forgetting doses you can try linking behaviors, using pill boxes, or an electronic reminder like an alarm on your phone or an app. Some people can also get automated phone calls as medication reminders.   Time spent: 7 minutes  Oswald Hillock, Pharm.D, BCPS Clinical Pharmacist 12/17/18

## 2018-12-17 NOTE — Progress Notes (Signed)
Patient ID: Christine Oneill, female    DOB: December 31, 1960, 57 y.o.   MRN: [id number]  HPI  Ms Falkner is a 57 y/o female with a history of pulmonary HTN, obstructive sleep apnea, HTN, previous tobacco use and chronic heart failure.   Echo report from 12/26/17 reviewed and showed an EF of 60-65% along with trivial AR and moderate TR.   Was in the ED 04/08/18 due to atypical chest pain. Labs were negative and she was released. Was in the ED 03/18/18 due to mild HF exacerbation. IV lasix given and she was released. Admitted 12/25/17 due to new onset HF. Cardiology consult obtained. Initially needed IV diuretics along with bipap. Transitioned to oral diuretics. Recommend outpatient sleep study. Discharged home with home health after 4 days.  She presents today for a follow-up visit with a chief complaint of minimal shortness of breath upon moderate exertion. She describes this as chronic in nature having been present for several years. She has associated fatigue, left lower leg edema at times and a gradual weight gain along with this. She denies any difficulty sleeping, abdominal distention, palpitations, chest pain, cough or dizziness. She has pulmonology follow-up 12/26/18.   Past Medical History:  Diagnosis Date  . Abnormal uterine bleeding (AUB)   . Asthma   . CHF (congestive heart failure) ([place])   . COPD (chronic obstructive pulmonary disease) ([place])   . Hypertension   . Obstructive sleep apnea    with CPAP  . Pulmonary HTN ([place])     Past Surgical History:  Procedure Laterality Date  . APPENDECTOMY    . CESAREAN SECTION    . COLPOSCOPY N/A 04/30/2018   Procedure: COLPOSCOPY;  Surgeon: Rubie Maid, MD;  Location: [facility] ORS;  Service: Gynecology;  Laterality: N/A;  . HYSTEROSCOPY W/D&C N/A 04/30/2018   Procedure: DILATATION AND CURETTAGE /HYSTEROSCOPY;  Surgeon: Rubie Maid, MD;  Location: [facility] ORS;  Service: Gynecology;  Laterality: N/A;   Family History  Problem Relation Age of Onset  . Lung  cancer Mother   . Kidney failure Father   . Hypertension Sister    Social History   Tobacco Use  . Smoking status: Former Smoker    Packs/day: 1.00    Types: Cigarettes    Last attempt to quit: 12/25/2017    Years since quitting: 0.9  . Smokeless tobacco: Never Used  Substance Use Topics  . Alcohol use: No   No Known Allergies  Prior to Admission medications   Medication Sig Start Date End Date Taking? Authorizing Provider  albuterol (PROVENTIL HFA;VENTOLIN HFA) 108 (90 Base) MCG/ACT inhaler Inhale 2 puffs into the lungs every 6 (six) hours as needed for wheezing or shortness of breath. 12/29/17  Yes Fritzi Mandes, MD  furosemide (LASIX) 40 MG tablet Take 1 tablet (40 mg total) by mouth daily. 12/29/17  Yes Fritzi Mandes, MD  HYDROcodone-acetaminophen (NORCO/VICODIN) 5-325 MG tablet Take 1 tablet by mouth every 6 (six) hours as needed for severe pain. 04/30/18  Yes Rubie Maid, MD  ibuprofen (ADVIL,MOTRIN) 800 MG tablet Take 1 tablet (800 mg total) by mouth every 8 (eight) hours as needed for mild pain or moderate pain. 04/30/18  Yes Rubie Maid, MD  metolazone (ZAROXOLYN) 2.5 MG tablet Take once per week, every Monday. 08/09/18  Yes Laverle Hobby, MD  metoprolol succinate (TOPROL-XL) 25 MG 24 hr tablet Take 0.5 tablets (12.5 mg total) by mouth daily. Hold if SBP<105 12/30/17  Yes Fritzi Mandes, MD  albuterol (PROVENTIL) (2.5 MG/3ML) 0.083% nebulizer solution  Take 3 mLs (2.5 mg total) by nebulization every 6 (six) hours as needed for wheezing or shortness of breath. Patient not taking: Reported on 12/17/2018 12/29/17   Fritzi Mandes, MD  docusate sodium (COLACE) 100 MG capsule Take 1 capsule (100 mg total) by mouth 2 (two) times daily as needed. Patient not taking: Reported on 12/17/2018 05/08/18   Rubie Maid, MD  megestrol (MEGACE) 40 MG tablet Take 1 tablet (40 mg total) by mouth 2 (two) times daily. Take one tablet three times a day for 3 days, then one tablet twice daily for 3 days,  then one tablet daily Patient not taking: Reported on 12/17/2018 05/08/18   Rubie Maid, MD    Review of Systems  Constitutional: Positive for fatigue (minimal). Negative for appetite change.  HENT: Negative for congestion, postnasal drip and sore throat.   Eyes: Negative.   Respiratory: Positive for shortness of breath (going up steps; walking long distances). Negative for cough and chest tightness.   Cardiovascular: Positive for leg swelling (left lower leg at times). Negative for chest pain and palpitations.  Gastrointestinal: Negative for abdominal distention and abdominal pain.  Endocrine: Negative.   Genitourinary: Negative.   Musculoskeletal: Positive for arthralgias (left upper leg burning). Negative for back pain and neck pain.  Skin: Negative.   Allergic/Immunologic: Negative.   Neurological: Negative for dizziness and light-headedness.  Hematological: Negative for adenopathy. Does not bruise/bleed easily.  Psychiatric/Behavioral: Negative for dysphoric mood and sleep disturbance (wearing oxygen on CPAP). The patient is not nervous/anxious.    Vitals:   12/17/18 0956  BP: 135/84  Pulse: 88  Resp: 18  SpO2: 97%  Weight: (!) 311 lb (141.1 kg)  Height: 5\' 3"  (1.6 m)   Wt Readings from Last 3 Encounters:  12/17/18 (!) 311 lb (141.1 kg)  05/15/18 (!) 300 lb 2 oz (136.1 kg)  05/08/18 (!) 303 lb 8 oz (137.7 kg)   Lab Results  Component Value Date   CREATININE 0.97 04/27/2018   CREATININE 1.28 (H) 04/08/2018   CREATININE 0.82 03/18/2018    Physical Exam  Constitutional: She is oriented to person, place, and time. She appears well-developed and well-nourished.  HENT:  Head: Normocephalic and atraumatic.  Neck: Normal range of motion. Neck supple. No JVD present.  Cardiovascular: Normal rate and regular rhythm.  Pulmonary/Chest: Effort normal. She has no wheezes. She has no rales.  Abdominal: Soft. She exhibits no distension. There is no abdominal tenderness.   Musculoskeletal:        General: Edema (trace edema in left lower leg) present. No tenderness.  Neurological: She is alert and oriented to person, place, and time.  Skin: Skin is warm and dry.  Psychiatric: She has a normal mood and affect. Her behavior is normal. Thought content normal.  Nursing note and vitals reviewed.  Assessment & Plan:  1: Chronic heart failure with preserved ejection fraction- - NYHA class II - euvolemic today - weighing daily. Reminded to call for an overnight weight gain of >2 pounds or a weekly weight gain of >5 pounds - weight up 11 pounds from last visit 7 months ago - not adding salt and has been reading food labels for sodium content. Reminded to keep daily sodium intake to 2000mg . - has been walking more but admits to not walking much due to Christmas holiday - has been trying to keep daily fluid intake to closer to 60 ounces - BNP on 04/08/18 was 23.0 - saw cardiology Nehemiah Massed) 04/03/18 - PharmD reconciled medications  with the patient - does not take the flu vaccine - pulmonology 12/26/18  2: HTN- - BP looks good today - saw PCP at [facility]) ~4-5 months ago and returns January 2020 - BMP on 04/27/18 reviewed and showed sodium 138, potassium 3.6 and GFR >60  3: Obstructive sleep apnea- - wearing CPAP nightly  - wearing oxygen at 2L upon exertion, bedtime and PRN at rest - saw pulmonologist Ashby Dawes) 04/27/18 - no longer smoking  4: Lymphedema- - stage 2 - trace edema in left lower leg; she says this occurs intermittently - could consider lymphapress compression boots if edema persists  Patient did not bring her medications nor a list. Each medication was verbally reviewed with the patient and she was encouraged to bring the bottles to every visit to confirm accuracy of list.  Patient to return in 6 months or sooner for any questions/problems before then.

## 2018-12-18 ENCOUNTER — Encounter: Payer: Self-pay | Admitting: Family

## 2018-12-24 ENCOUNTER — Ambulatory Visit: Payer: BLUE CROSS/BLUE SHIELD | Admitting: Internal Medicine

## 2018-12-25 NOTE — Progress Notes (Deleted)
[facility] Consultation      Assessment and Plan:  Suspected obstructive sleep apnea with with obesity hypoventilation syndrome with morbid obesity. - Patient has sleep study scheduled on 1/22, start on CPAP or BiPAP based on those recommendations. --Is currently on a CPAP machine using every night.   Pulmonary edema. --Continued symptoms, will give dose of zaroxolyn x1, 2.5 mg every Monday can increase if needed, but will need to monitor potassium.   Chronic hypoxic respiratory failure. -Oxygen saturation is 89-90% at rest, she is advised that she should continue using oxygen at 2 L with ambulation particularly when leaving the home, and at night.  Is less important when she is sitting.  Pulmonary hypertension. 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 -Continue management of underlying conditions.  Preoperative pulmonary respiratory examination. - Patient appears to be optimized for surgery at this time, her respiratory status appears at baseline. - Recommend avoidance of general anesthesia possible, recommend that she be on CPAP in the peri-and postoperative periods, as well as intraoperatively if the patient is not being intubated.  No orders of the defined types were placed in this encounter.  No orders of the defined types were placed in this encounter.   No follow-ups on file.     Date: 12/25/2018  MRN# [id number] Christine Oneill 1961/02/24   Christine Oneill is a 58 y.o. old female seen in consultation for chief complaint of:    No chief complaint on file.   HPI:  The patient is a 58 year old female with  diastolic congestive heart failure/cor pulmonale, obesity hypoventilation, chronic edema and volume overload.   Since her last visit she has been doing 4 hours at work per day. She works at a [employer]. She remains on CPAP every night. She wears oxygen at 2L during the day and with CPAP at  night. She feels that breathing is ok. She does not need oxygen when sitting, uses as needed when moving around.  She remain on zaroxolyn 2.5 mg once per week. Overall she feels that she has been doing well, and finds that her respiratory status is at baseline.  She has been having some uterine bleeding and has been planned for a colposcopy and hysteroscopy.  Therefore she is also here today for optimization/clearance.  **Echocardiogram 12/26/2017>>- The right ventricular systolic pressure was increased consistent with severe pulmonary hypertension.  EF 60%. chest x-ray 03/18/18, lungs are unremarkable, slightly less pulmonary edema than on previous chest x-ray on 12/25/17 which showed; findings of chronic bronchitis, pulmonary edema cardiomegaly.  Desat walk at rest on RA, sat was 94% and HR 77. Sat dropped to 86% and HR 99 after 180 feet, started on 2L oxygen and sat was 91% at rest. Ambulated addition 180 feet with mild dyspnea with oxygen 2L sat was 90% and HR 113.   Medication:    Current Outpatient Medications:  .  albuterol (PROVENTIL HFA;VENTOLIN HFA) 108 (90 Base) MCG/ACT inhaler, Inhale 2 puffs into the lungs every 6 (six) hours as needed for wheezing or shortness of breath., Disp: 1 Inhaler, Rfl: 2 .  albuterol (PROVENTIL) (2.5 MG/3ML) 0.083% nebulizer solution, Take 3 mLs (2.5 mg total) by nebulization every 6 (six) hours as needed for wheezing or shortness of breath. (Patient not taking: Reported on 12/17/2018), Disp: 75 mL, Rfl: 12 .  docusate sodium (COLACE) 100 MG capsule, Take 1 capsule (100 mg total) by mouth 2 (two) times daily as needed. (Patient not taking: Reported on  12/17/2018), Disp: 30 capsule, Rfl: 2 .  furosemide (LASIX) 40 MG tablet, Take 1 tablet (40 mg total) by mouth daily., Disp: 30 tablet, Rfl: 1 .  HYDROcodone-acetaminophen (NORCO/VICODIN) 5-325 MG tablet, Take 1 tablet by mouth every 6 (six) hours as needed for severe pain., Disp: 10 tablet, Rfl: 0 .  ibuprofen  (ADVIL,MOTRIN) 800 MG tablet, Take 1 tablet (800 mg total) by mouth every 8 (eight) hours as needed for mild pain or moderate pain., Disp: 60 tablet, Rfl: 1 .  megestrol (MEGACE) 40 MG tablet, Take 1 tablet (40 mg total) by mouth 2 (two) times daily. Take one tablet three times a day for 3 days, then one tablet twice daily for 3 days, then one tablet daily (Patient not taking: Reported on 12/17/2018), Disp: 90 tablet, Rfl: 3 .  metolazone (ZAROXOLYN) 2.5 MG tablet, Take once per week, every Monday., Disp: 8 tablet, Rfl: 2 .  metoprolol succinate (TOPROL-XL) 25 MG 24 hr tablet, Take 0.5 tablets (12.5 mg total) by mouth daily. Hold if SBP<105, Disp: 30 tablet, Rfl: 0   Allergies:  Patient has no known allergies.      LABORATORY PANEL:   CBC No results for input(s): WBC, HGB, HCT, PLT in the last 168 hours. ------------------------------------------------------------------------------------------------------------------  Chemistries  No results for input(s): NA, K, CL, CO2, GLUCOSE, BUN, CREATININE, CALCIUM, MG, AST, ALT, ALKPHOS, BILITOT in the last 168 hours.  Invalid input(s): GFRCGP ------------------------------------------------------------------------------------------------------------------  Cardiac Enzymes No results for input(s): TROPONINI in the last 168 hours. ------------------------------------------------------------  RADIOLOGY:  No results found.     Thank  you for the consultation and for allowing [facility] to assist in the care of your patient. Our recommendations are noted above.  Please contact us if we can be of further service.  Marda Stalker, M.D., F.C.C.P.  Board Certified in Internal Medicine, Pulmonary Medicine, [place], and Sleep Medicine.  [facility] Office Number: [phone]   12/25/2018

## 2018-12-26 ENCOUNTER — Ambulatory Visit: Payer: BLUE CROSS/BLUE SHIELD | Admitting: Internal Medicine

## 2018-12-27 ENCOUNTER — Encounter: Payer: Self-pay | Admitting: Internal Medicine

## 2018-12-27 ENCOUNTER — Other Ambulatory Visit: Payer: Self-pay | Admitting: Physician Assistant

## 2018-12-27 DIAGNOSIS — Z1231 Encounter for screening mammogram for malignant neoplasm of breast: Secondary | ICD-10-CM

## 2018-12-28 ENCOUNTER — Telehealth: Payer: Self-pay | Admitting: Internal Medicine

## 2018-12-28 ENCOUNTER — Other Ambulatory Visit: Payer: Self-pay | Admitting: Obstetrics and Gynecology

## 2018-12-28 MED ORDER — METOLAZONE 2.5 MG PO TABS: ORAL_TABLET | ORAL | 0 refills | Status: AC

## 2018-12-28 NOTE — Telephone Encounter (Signed)
Called and spoke to pt, who is requesting refill on Zaroxolyn 2.5mg . Pt last seen 04/27/18 and instructed to f/u on 98mo. It does not appear that apt was scheduled.  Pt has been scheduled for OV on 01/01/19. One month supply of Zaroxolyn has been sent to preferred pharmacy to get pt by until her OV. Nothing further is needed.

## 2018-12-31 ENCOUNTER — Other Ambulatory Visit: Payer: Self-pay | Admitting: Internal Medicine

## 2018-12-31 NOTE — Telephone Encounter (Signed)
Received invalid rx for Xaroxlyn will call pharmacy to clarify. Spoke with Thurmond Butts the pharmicist.

## 2018-12-31 NOTE — Progress Notes (Signed)
[facility] Consultation      Assessment and Plan:  Severe obstructive sleep apnea with obesity hypoventilation syndrome with morbid obesity. - Her BiPAP was prescribed when she was in the hospital, insurance does not not cover sleep study. -Continue using BiPAP, we will see if we can find out what the exact settings are.  Pulmonary edema. --Improved with Zaroxolyn 2.5 mg every Monday.  Will continue.  Chronic hypoxic respiratory failure. -Oxygen saturation is 90% at rest, she is advised that she should continue using oxygen at 2 L with ambulation particularly when leaving the home, and at night.  Is less important when she is sitting. - I have advised her to get a pulse oximeter so she can monitor her oxygen at home, she should use supplemental oxygen whenever her oxygen saturation drops below 90%.  Pulmonary hypertension. 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 -Continue management of underlying conditions.  Return in about 6 months (around 07/02/2019).     Date: 12/31/2018  MRN# [id number] Christine Oneill 1961-07-30   Christine Oneill is a 58 y.o. old female seen in consultation for chief complaint of:    Chief Complaint  Patient presents with  . Follow-up    pt reports of sob with exertion & dry cough. wearing 2L with exertion and bled into cpap. wearing cpap avg 6hr nightly- feels pressure & mask are okay. IPJ:[id number]    HPI:  The patient is a 58 year old female with  diastolic congestive heart failure/cor pulmonale, obesity hypoventilation, chronic edema and volume overload.    She does not need oxygen when sitting, uses as needed when moving around.  She remain on zaroxolyn 2.5 mg once per week. Overall she feels that she has been doing well, and finds that her respiratory status is at baseline. Today she feels that her breathing has been doing well. She is on no inhalers. She is using PAP every night, for about 6 to 7 hours per  night. When wakes in am feels rested, she feels minimally tired during the day, and does not need to nap. She continues to take zaroxolyn 2.5 mg every Monday and notes that her leg swelling goes down, her breathing also improves.    **Echocardiogram 12/26/2017>>- The right ventricular systolic pressure was increased consistent with severe pulmonary hypertension.  EF 60%. chest x-ray 03/18/18, lungs are unremarkable, slightly less pulmonary edema than on previous chest x-ray on 12/25/17 which showed; findings of chronic bronchitis, pulmonary edema cardiomegaly.  Desat walk at rest on RA, sat was 94% and HR 77. Sat dropped to 86% and HR 99 after 180 feet, started on 2L oxygen and sat was 91% at rest. Ambulated addition 180 feet with mild dyspnea with oxygen 2L sat was 90% and HR 113.   Medication:    Current Outpatient Medications:  .  albuterol (PROVENTIL HFA;VENTOLIN HFA) 108 (90 Base) MCG/ACT inhaler, Inhale 2 puffs into the lungs every 6 (six) hours as needed for wheezing or shortness of breath., Disp: 1 Inhaler, Rfl: 2 .  albuterol (PROVENTIL) (2.5 MG/3ML) 0.083% nebulizer solution, Take 3 mLs (2.5 mg total) by nebulization every 6 (six) hours as needed for wheezing or shortness of breath. (Patient not taking: Reported on 12/17/2018), Disp: 75 mL, Rfl: 12 .  docusate sodium (COLACE) 100 MG capsule, Take 1 capsule (100 mg total) by mouth 2 (two) times daily as needed. (Patient not taking: Reported on 12/17/2018), Disp: 30 capsule, Rfl: 2 .  furosemide (LASIX) 40 MG tablet, Take  1 tablet (40 mg total) by mouth daily., Disp: 30 tablet, Rfl: 1 .  HYDROcodone-acetaminophen (NORCO/VICODIN) 5-325 MG tablet, Take 1 tablet by mouth every 6 (six) hours as needed for severe pain., Disp: 10 tablet, Rfl: 0 .  ibuprofen (ADVIL,MOTRIN) 800 MG tablet, TAKE 1 TABLET BY MOUTH EVERY 8 HOURS AS NEEDED FOR MODERATE PAIN, Disp: 60 tablet, Rfl: 0 .  megestrol (MEGACE) 40 MG tablet, Take 1 tablet (40 mg total) by mouth 2  (two) times daily. Take one tablet three times a day for 3 days, then one tablet twice daily for 3 days, then one tablet daily (Patient not taking: Reported on 12/17/2018), Disp: 90 tablet, Rfl: 3 .  metolazone (ZAROXOLYN) 2.5 MG tablet, Take once per week, every Monday., Disp: 8 tablet, Rfl: 0 .  metoprolol succinate (TOPROL-XL) 25 MG 24 hr tablet, Take 0.5 tablets (12.5 mg total) by mouth daily. Hold if SBP<105, Disp: 30 tablet, Rfl: 0   Allergies:  Patient has no known allergies.    Review of Systems:  Constitutional: Feels well. Cardiovascular: Denies chest pain, exertional chest pain.  Pulmonary: Denies hemoptysis, pleuritic chest pain.   The remainder of systems were reviewed and were found to be negative other than what is documented in the HPI.    Physical Examination:   VS: BP 134/78 (BP Location: Left Arm, Cuff Size: Normal)   Pulse 83   Ht 5\' 3"  (1.6 m)   Wt (!) 309 lb 3.2 oz (140.3 kg)   SpO2 98%   BMI 54.77 kg/m   General Appearance: No distress  Neuro:without focal findings, mental status, speech normal, alert and oriented HEENT: PERRLA, EOM intact Pulmonary: No wheezing, No rales  CardiovascularNormal S1,S2.  No m/r/g.  Abdomen: Benign, Soft, non-tender, No masses Renal:  No costovertebral tenderness  GU:  No performed at this time. Endoc: No evident thyromegaly, no signs of acromegaly or Cushing features Skin:   warm, no rashes, no ecchymosis  Extremities: normal, no cyanosis, clubbing.    LABORATORY PANEL:   CBC No results for input(s): WBC, HGB, HCT, PLT in the last 168 hours. ------------------------------------------------------------------------------------------------------------------  Chemistries  No results for input(s): NA, K, CL, CO2, GLUCOSE, BUN, CREATININE, CALCIUM, MG, AST, ALT, ALKPHOS, BILITOT in the last 168 hours.  Invalid input(s):  GFRCGP ------------------------------------------------------------------------------------------------------------------  Cardiac Enzymes No results for input(s): TROPONINI in the last 168 hours. ------------------------------------------------------------  RADIOLOGY:  No results found.     Thank  you for the consultation and for allowing [facility] to assist in the care of your patient. Our recommendations are noted above.  Please contact us if we can be of further service.  Marda Stalker, M.D., F.C.C.P.  Board Certified in Internal Medicine, Pulmonary Medicine, [place], and Sleep Medicine.  [facility] Office Number: [phone]   12/31/2018

## 2019-01-01 ENCOUNTER — Ambulatory Visit (INDEPENDENT_AMBULATORY_CARE_PROVIDER_SITE_OTHER): Payer: BLUE CROSS/BLUE SHIELD | Admitting: Internal Medicine

## 2019-01-01 ENCOUNTER — Encounter: Payer: Self-pay | Admitting: Internal Medicine

## 2019-01-01 VITALS — BP 134/78 | HR 83 | Ht 63.0 in | Wt 309.2 lb

## 2019-01-01 DIAGNOSIS — J9611 Chronic respiratory failure with hypoxia: Secondary | ICD-10-CM | POA: Diagnosis not present

## 2019-01-01 DIAGNOSIS — E662 Morbid (severe) obesity with alveolar hypoventilation: Secondary | ICD-10-CM

## 2019-01-01 DIAGNOSIS — I272 Pulmonary hypertension, unspecified: Secondary | ICD-10-CM | POA: Diagnosis not present

## 2019-01-01 DIAGNOSIS — G4733 Obstructive sleep apnea (adult) (pediatric): Secondary | ICD-10-CM

## 2019-01-01 NOTE — Patient Instructions (Addendum)
Continue using  Your PAP every night for the whole night, every night.   I would suggest to get a pulse oximeter, use oxygen whenever you are saturation level drops below 90%.

## 2019-02-28 ENCOUNTER — Other Ambulatory Visit: Payer: Self-pay | Admitting: Obstetrics and Gynecology

## 2019-05-18 ENCOUNTER — Other Ambulatory Visit: Payer: Self-pay | Admitting: Obstetrics and Gynecology

## 2019-05-26 ENCOUNTER — Other Ambulatory Visit: Payer: Self-pay | Admitting: Obstetrics and Gynecology

## 2019-05-28 NOTE — Telephone Encounter (Signed)
Spoke with pt she concerning the medication refill. Pt stated that she did indeed still need the medication for pain. Medication was refilled without any refills.

## 2019-05-28 NOTE — Telephone Encounter (Signed)
Please find out if patient still needs this medication.

## 2019-06-07 ENCOUNTER — Encounter: Payer: Self-pay | Admitting: Emergency Medicine

## 2019-06-07 ENCOUNTER — Emergency Department
Admission: EM | Admit: 2019-06-07 | Discharge: 2019-06-07 | Disposition: A | Discharge status: Home / Self Care | Payer: BLUE CROSS/BLUE SHIELD | Attending: Emergency Medicine | Admitting: Emergency Medicine

## 2019-06-07 ENCOUNTER — Emergency Department: Payer: BLUE CROSS/BLUE SHIELD

## 2019-06-07 ENCOUNTER — Other Ambulatory Visit: Payer: Self-pay

## 2019-06-07 DIAGNOSIS — I110 Hypertensive heart disease with heart failure: Secondary | ICD-10-CM | POA: Insufficient documentation

## 2019-06-07 DIAGNOSIS — I509 Heart failure, unspecified: Secondary | ICD-10-CM | POA: Insufficient documentation

## 2019-06-07 DIAGNOSIS — J449 Chronic obstructive pulmonary disease, unspecified: Secondary | ICD-10-CM | POA: Diagnosis not present

## 2019-06-07 DIAGNOSIS — M5442 Lumbago with sciatica, left side: Secondary | ICD-10-CM | POA: Diagnosis not present

## 2019-06-07 DIAGNOSIS — Z79899 Other long term (current) drug therapy: Secondary | ICD-10-CM | POA: Insufficient documentation

## 2019-06-07 DIAGNOSIS — M79605 Pain in left leg: Secondary | ICD-10-CM | POA: Diagnosis present

## 2019-06-07 DIAGNOSIS — Z87891 Personal history of nicotine dependence: Secondary | ICD-10-CM | POA: Diagnosis not present

## 2019-06-07 LAB — URINALYSIS, COMPLETE (UACMP) WITH MICROSCOPIC
Bacteria, UA: NONE SEEN
Bilirubin Urine: NEGATIVE
Glucose, UA: NEGATIVE mg/dL
Hgb urine dipstick: NEGATIVE
Ketones, ur: NEGATIVE mg/dL
Leukocytes,Ua: NEGATIVE
Nitrite: NEGATIVE
Protein, ur: NEGATIVE mg/dL
Specific Gravity, Urine: 1.021 (ref 1.005–1.030)
pH: 6 (ref 5.0–8.0)

## 2019-06-07 LAB — COMPREHENSIVE METABOLIC PANEL WITH GFR
ALT: 17 U/L (ref 0–44)
AST: 15 U/L (ref 15–41)
Albumin: 3.9 g/dL (ref 3.5–5.0)
Alkaline Phosphatase: 66 U/L (ref 38–126)
Anion gap: 8 (ref 5–15)
BUN: 23 mg/dL — ABNORMAL HIGH (ref 6–20)
CO2: 30 mmol/L (ref 22–32)
Calcium: 8.9 mg/dL (ref 8.9–10.3)
Chloride: 101 mmol/L (ref 98–111)
Creatinine, Ser: 0.93 mg/dL (ref 0.44–1.00)
GFR calc Af Amer: >60 mL/min (ref >60)
GFR calc non Af Amer: >60 mL/min (ref >60)
Glucose, Bld: 104 mg/dL — ABNORMAL HIGH (ref 70–99)
Potassium: 4.5 mmol/L (ref 3.5–5.1)
Sodium: 139 mmol/L (ref 135–145)
Total Bilirubin: 0.2 mg/dL — ABNORMAL LOW (ref 0.3–1.2)
Total Protein: 8 g/dL (ref 6.5–8.1)

## 2019-06-07 LAB — CBC WITH DIFFERENTIAL/PLATELET
Abs Immature Granulocytes: 0.04 K/uL (ref 0.00–0.07)
Basophils Absolute: 0.1 K/uL (ref 0.0–0.1)
Basophils Relative: 1 %
Eosinophils Absolute: 0.2 K/uL (ref 0.0–0.5)
Eosinophils Relative: 3 %
HCT: 43.6 % (ref 36.0–46.0)
Hemoglobin: 13.3 g/dL (ref 12.0–15.0)
Immature Granulocytes: 1 %
Lymphocytes Relative: 37 %
Lymphs Abs: 3 K/uL (ref 0.7–4.0)
MCH: 29 pg (ref 26.0–34.0)
MCHC: 30.5 g/dL (ref 30.0–36.0)
MCV: 95.2 fL (ref 80.0–100.0)
Monocytes Absolute: 0.6 K/uL (ref 0.1–1.0)
Monocytes Relative: 8 %
Neutro Abs: 4.2 K/uL (ref 1.7–7.7)
Neutrophils Relative %: 50 %
Platelets: 318 K/uL (ref 150–400)
RBC: 4.58 MIL/uL (ref 3.87–5.11)
RDW: 15.5 % (ref 11.5–15.5)
WBC: 8.2 K/uL (ref 4.0–10.5)
nRBC: 0 % (ref 0.0–0.2)

## 2019-06-07 MED ORDER — HYDROCODONE-ACETAMINOPHEN 5-325 MG PO TABS
1.0000 | ORAL_TABLET | Freq: Once | ORAL | Status: AC
  Administered 2019-06-07: 22:00:00 1 via ORAL
  Filled 2019-06-07: qty 1

## 2019-06-07 MED ORDER — ONDANSETRON 4 MG PO TBDP
4.0000 mg | ORAL_TABLET | Freq: Once | ORAL | Status: AC
  Administered 2019-06-07: 4 mg via ORAL
  Filled 2019-06-07: qty 1

## 2019-06-07 MED ORDER — TRAMADOL HCL 50 MG PO TABS: 50.0000 mg | ORAL_TABLET | Freq: Four times a day (QID) | ORAL | 0 refills | Status: AC | PRN

## 2019-06-07 MED ORDER — PREDNISONE 10 MG (21) PO TBPK: ORAL_TABLET | ORAL | 0 refills | Status: DC

## 2019-06-07 MED ORDER — ONDANSETRON HCL 4 MG PO TABS: 4.0000 mg | ORAL_TABLET | Freq: Three times a day (TID) | ORAL | 0 refills | Status: AC | PRN

## 2019-06-07 NOTE — ED Triage Notes (Signed)
Here for left lower back pain. Hx pain r/t sciatica but this pain worse than normally gets with it. Denies loss bowel or bladder. No urinary sx per pt.  No fevers.  Pain worse when up and walking around. Wears 2 L [place] at baseline.

## 2019-06-07 NOTE — ED Provider Notes (Signed)
[facility] Emergency Department Provider Note  ____________________________________________  Time seen: Approximately 9:56 PM  I have reviewed the triage vital signs and the nursing notes.   HISTORY  Chief Complaint Sciatica    HPI Christine Oneill is a 58 y.o. female presents to the emergency department with burning pain along the posterior lateral aspect of the left lower extremity that has been going on for the past 2 to 3 days.  Patient denies dysuria, hematuria or increased urinary frequency.  She does state that left lower extremity seems more swollen to her than usual.  No erythema overlying the skin.  Patient does state that she has been much more sedentary since being diagnosed with CHF.  She denies current chest pain or worsening shortness of breath.  She has never been diagnosed with a DVT or PE in the past.  No falls or mechanisms of trauma.  No other alleviating measures have been attempted.        Past Medical History:  Diagnosis Date  . Abnormal uterine bleeding (AUB)   . Asthma   . CHF (congestive heart failure) ([place])   . COPD (chronic obstructive pulmonary disease) ([place])   . Hypertension   . Obstructive sleep apnea    with CPAP  . Pulmonary HTN [facility])     Patient Active Problem List   Diagnosis Date Noted  . HTN (hypertension) 01/04/2018  . Obstructive sleep apnea 01/04/2018  . Lymphedema 01/04/2018  . Pulmonary hypertension ([place]) 12/26/2017  . Obesity hypoventilation syndrome ([place]) 12/26/2017  . Smoker 12/26/2017  . CHF (congestive heart failure) ([place]) 12/25/2017    Past Surgical History:  Procedure Laterality Date  . APPENDECTOMY    . CESAREAN SECTION    . COLPOSCOPY N/A 04/30/2018   Procedure: COLPOSCOPY;  Surgeon: Rubie Maid, MD;  Location: [facility] ORS;  Service: Gynecology;  Laterality: N/A;  . HYSTEROSCOPY W/D&C N/A 04/30/2018   Procedure: DILATATION AND CURETTAGE /HYSTEROSCOPY;  Surgeon: Rubie Maid, MD;  Location: [facility]  ORS;  Service: Gynecology;  Laterality: N/A;    Prior to Admission medications   Medication Sig Start Date End Date Taking? Authorizing Provider  albuterol (PROVENTIL HFA;VENTOLIN HFA) 108 (90 Base) MCG/ACT inhaler Inhale 2 puffs into the lungs every 6 (six) hours as needed for wheezing or shortness of breath. 12/29/17   Fritzi Mandes, MD  albuterol (PROVENTIL) (2.5 MG/3ML) 0.083% nebulizer solution Take 3 mLs (2.5 mg total) by nebulization every 6 (six) hours as needed for wheezing or shortness of breath. 12/29/17   Fritzi Mandes, MD  docusate sodium (COLACE) 100 MG capsule Take 1 capsule (100 mg total) by mouth 2 (two) times daily as needed. 05/08/18   Rubie Maid, MD  furosemide (LASIX) 40 MG tablet Take 1 tablet (40 mg total) by mouth daily. 12/29/17   Fritzi Mandes, MD  HYDROcodone-acetaminophen (NORCO/VICODIN) 5-325 MG tablet Take 1 tablet by mouth every 6 (six) hours as needed for severe pain. 04/30/18   Rubie Maid, MD  ibuprofen (ADVIL) 800 MG tablet TAKE 1 TABLET BY MOUTH EVERY 8 HOURS AS NEEDED FOR MODERATE PAIN. 05/28/19   Rubie Maid, MD  megestrol (MEGACE) 40 MG tablet Take 1 tablet (40 mg total) by mouth 2 (two) times daily. Take one tablet three times a day for 3 days, then one tablet twice daily for 3 days, then one tablet daily 05/08/18   Rubie Maid, MD  metolazone (ZAROXOLYN) 2.5 MG tablet Take once per week, every Monday. 12/28/18   Laverle Hobby, MD  metoprolol  succinate (TOPROL-XL) 25 MG 24 hr tablet Take 0.5 tablets (12.5 mg total) by mouth daily. Hold if SBP<105 12/30/17   Fritzi Mandes, MD  ondansetron (ZOFRAN) 4 MG tablet Take 1 tablet (4 mg total) by mouth every 8 (eight) hours as needed for up to 5 days for nausea or vomiting. 06/07/19 06/12/19  Lannie Fields, PA-C  predniSONE (STERAPRED UNI-PAK 21 TAB) 10 MG (21) TBPK tablet Take 6 tabs the the 1st day. Take 6 tabs the the 2nd day. Take 5 tabs the the 3rd day. Take 5 tabs the 4th day. Take 4 tabs the the 5th day.Take 4 tabs  the the 6th day.Take 3 tabs the 7th day.Take 3 tabs the 8th day. Take 2 tabs the 9th day. Take 2 tabs the 10th day. Take 1 tab the 11th day. Take 1 tab the 12th day. 06/07/19   Lannie Fields, PA-C  traMADol (ULTRAM) 50 MG tablet Take 1 tablet (50 mg total) by mouth every 6 (six) hours as needed for up to 3 days. 06/07/19 06/10/19  Lannie Fields, PA-C    Allergies Patient has no known allergies.  Family History  Problem Relation Age of Onset  . Lung cancer Mother   . Kidney failure Father   . Hypertension Sister     Social History Social History   Tobacco Use  . Smoking status: Former Smoker    Packs/day: 1.00    Types: Cigarettes    Quit date: 12/25/2017    Years since quitting: 1.4  . Smokeless tobacco: Never Used  Substance Use Topics  . Alcohol use: No  . Drug use: No     Review of Systems  Constitutional: No fever/chills Eyes: No visual changes. No discharge ENT: No upper respiratory complaints. Cardiovascular: no chest pain. Respiratory: no cough. No SOB. Gastrointestinal: No abdominal pain.  No nausea, no vomiting.  No diarrhea.  No constipation. Genitourinary: Negative for dysuria. No hematuria Musculoskeletal: Patient has low back pain with left lower extremity radiculopathy.  Skin: Negative for rash, abrasions, lacerations, ecchymosis. Neurological: Negative for headaches, focal weakness or numbness.   ____________________________________________   PHYSICAL EXAM:  VITAL SIGNS: ED Triage Vitals  Enc Vitals Group     BP 06/07/19 1732 (!) 143/81     Pulse Rate 06/07/19 1732 94     Resp 06/07/19 1732 18     Temp 06/07/19 1732 98.1 F (36.7 C)     Temp Source 06/07/19 1732 Oral     SpO2 06/07/19 1732 96 %     Weight 06/07/19 1732 295 lb (133.8 kg)     Height 06/07/19 1732 5\' 3"  (1.6 m)     Head Circumference --      Peak Flow --      Pain Score 06/07/19 1731 9     Pain Loc --      Pain Edu? --      Excl. in [place]? --      Constitutional: Alert and  oriented. Well appearing and in no acute distress. Eyes: Conjunctivae are normal. PERRL. EOMI. Head: Atraumatic. Cardiovascular: Normal rate, regular rhythm. Normal S1 and S2.  Good peripheral circulation. Respiratory: Normal respiratory effort without tachypnea or retractions. Lungs CTAB. Good air entry to the bases with no decreased or absent breath sounds. Musculoskeletal: Full range of motion to all extremities. No gross deformities appreciated.  Patient has paraspinal muscle tenderness on the left.  Positive straight leg raise test, left. Neurologic:  Normal speech and language. No gross  focal neurologic deficits are appreciated.  Skin:  Skin is warm, dry and intact. No rash noted. Psychiatric: Mood and affect are normal. Speech and behavior are normal. Patient exhibits appropriate insight and judgement.   ____________________________________________   LABS (all labs ordered are listed, but only abnormal results are displayed)  Labs Reviewed  COMPREHENSIVE METABOLIC PANEL - Abnormal; Notable for the following components:      Result Value   Glucose, Bld 104 (*)    BUN 23 (*)    Total Bilirubin 0.2 (*)    All other components within normal limits  URINALYSIS, COMPLETE (UACMP) WITH MICROSCOPIC - Abnormal; Notable for the following components:   Color, Urine YELLOW (*)    APPearance CLEAR (*)    All other components within normal limits  CBC WITH DIFFERENTIAL/PLATELET   ____________________________________________  EKG   ____________________________________________  RADIOLOGY I personally viewed and evaluated these images as part of my medical decision making, as well as reviewing the written report by the radiologist.  US Venous Img Lower Unilateral Left  Result Date: 06/07/2019 CLINICAL DATA:  Left leg pain and swelling/edema EXAM: Left LOWER EXTREMITY VENOUS DOPPLER ULTRASOUND TECHNIQUE: Gray-scale sonography with graded compression, as well as color Doppler and duplex  ultrasound were performed to evaluate the lower extremity deep venous systems from the level of the common femoral vein and including the common femoral, femoral, profunda femoral, popliteal and calf veins including the posterior tibial, peroneal and gastrocnemius veins when visible. The superficial great saphenous vein was also interrogated. Spectral Doppler was utilized to evaluate flow at rest and with distal augmentation maneuvers in the common femoral, femoral and popliteal veins. COMPARISON:  None. FINDINGS: Contralateral Common Femoral Vein: Respiratory phasicity is normal and symmetric with the symptomatic side. No evidence of thrombus. Normal compressibility. Common Femoral Vein: No evidence of thrombus. Normal compressibility, respiratory phasicity and response to augmentation. Saphenofemoral Junction: No evidence of thrombus. Normal compressibility and flow on color Doppler imaging. Profunda Femoral Vein: No evidence of thrombus. Normal compressibility and flow on color Doppler imaging. Femoral Vein: No evidence of thrombus. Normal compressibility, respiratory phasicity and response to augmentation. Popliteal Vein: No evidence of thrombus. Normal compressibility, respiratory phasicity and response to augmentation. Calf Veins: No evidence of thrombus. Normal compressibility and flow on color Doppler imaging. IMPRESSION: No evidence of deep venous thrombosis. Electronically Signed   By: Donavan Foil M.D.   On: 06/07/2019 21:05    ____________________________________________    PROCEDURES  Procedure(s) performed:    Procedures    Medications  HYDROcodone-acetaminophen (NORCO/VICODIN) 5-325 MG per tablet 1 tablet (1 tablet Oral Given 06/07/19 2144)  ondansetron (ZOFRAN-ODT) disintegrating tablet 4 mg (4 mg Oral Given 06/07/19 2144)     ____________________________________________   INITIAL IMPRESSION / ASSESSMENT AND PLAN / ED COURSE  Pertinent labs & imaging results that were  available during my care of the patient were reviewed by me and considered in my medical decision making (see chart for details).  Review of the [place] CSRS was performed in accordance of the [place] prior to dispensing any controlled drugs.           Assessment and Plan:  Low back pain 58 year old female presents to the emergency department with burning low back pain that radiates along the posterior lateral aspect of the left thigh.  Patient did state that she has been more sedentary since being diagnosed with CHF and has noticed some swelling of the left lower extremity.  On physical exam, patient is mildly hypertensive  but vital signs are otherwise reassuring.  Patient has a positive straight leg raise test on the left and has paraspinal muscle tenderness along the lumbar spine.  There is no pitting edema of the left lower extremity and there is no overlying erythema.  Differential diagnosis includes DVT, sciatica, trochanteric bursitis, symptomatic anemia...  Venous ultrasound of the left lower extremity revealed no evidence of thromboembolism.  Patient had no reproducible tenderness over over the left trochanteric bursa.  Basic labs obtained in the emergency department were reassuring.   Patient was given prednisone and Robaxin in the emergency department.  Patient's pain was still not well controlled and she was given Norco.  Patient was discharged home with prednisone and tramadol.  She was advised to follow-up with primary care.  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  All patient questions were answered.    ____________________________________________  FINAL CLINICAL IMPRESSION(S) / ED DIAGNOSES  Final diagnoses:  Acute left-sided low back pain with left-sided sciatica      NEW MEDICATIONS STARTED DURING THIS VISIT:  ED Discharge Orders         Ordered    traMADol (ULTRAM) 50 MG tablet  Every 6 hours PRN     06/07/19  2133    predniSONE (STERAPRED UNI-PAK 21 TAB) 10 MG (21) TBPK tablet     06/07/19 2133    ondansetron (ZOFRAN) 4 MG tablet  Every 8 hours PRN     06/07/19 2133              This chart was dictated using voice recognition software/Dragon. Despite best efforts to proofread, errors can occur which can change the meaning. Any change was purely unintentional.    Lannie Fields, PA-C 06/07/19 2202    Delman Kitten, MD 06/07/19 2356

## 2019-06-07 NOTE — ED Notes (Signed)
Pt has c/o of lower back pain that is making it difficult to walk. Pt states pain has increased this week and become unbearable.

## 2019-06-14 ENCOUNTER — Telehealth: Payer: Self-pay

## 2019-06-14 NOTE — Telephone Encounter (Signed)
TELEPHONE CALL NOTE  Bonita Brindisi has been deemed a candidate for a follow-up tele-health visit to limit community exposure during the Covid-19 pandemic. I spoke with the patient via phone to ensure availability of phone/video source, confirm preferred email & phone number, discuss instructions and expectations, and review consent.   I reminded Christyna Letendre to be prepared with any vital sign and/or heart rhythm information that could potentially be obtained via home monitoring, at the time of her visit.  Finally, I reminded Sangeeta Youse to expect an e-mail containing a link for their video-based visit approximately 15 minutes before her visit, or alternatively, a phone call at the time of her visit if her visit is planned to be a phone encounter.  Did the patient verbally consent to treatment as below? YES  Gaylord Shih, CMA 06/14/2019 12:44 PM  CONSENT FOR TELE-HEALTH VISIT - PLEASE REVIEW  I hereby voluntarily request, consent and authorize [facility] and its employed or contracted physicians, physician assistants, nurse practitioners or other licensed health care professionals (the Practitioner), to provide me with telemedicine health care services (the "Services") as deemed necessary by the treating Practitioner. I acknowledge and consent to receive the Services by the Practitioner via telemedicine. I understand that the telemedicine visit will involve communicating with the Practitioner through telephonic communication technology and the disclosure of certain medical information by electronic transmission. I acknowledge that I have been given the opportunity to request an in-person assessment or other available alternative prior to the telemedicine visit and am voluntarily participating in the telemedicine visit.  I understand that I have the right to withhold or withdraw my consent to the use of telemedicine in the course of my care at any time, without affecting my right to  future care or treatment, and that the Practitioner or I may terminate the telemedicine visit at any time. I understand that I have the right to inspect all information obtained and/or recorded in the course of the telemedicine visit and may receive copies of available information for a reasonable fee.  I understand that some of the potential risks of receiving the Services via telemedicine include:  Marland Kitchen Delay or interruption in medical evaluation due to technological equipment failure or disruption; . Information transmitted may not be sufficient (e.g. poor resolution of images) to allow for appropriate medical decision making by the Practitioner; and/or  . In rare instances, security protocols could fail, causing a breach of personal health information.  Furthermore, I acknowledge that it is my responsibility to provide information about my medical history, conditions and care that is complete and accurate to the best of my ability. I acknowledge that Practitioner's advice, recommendations, and/or decision may be based on factors not within their control, such as incomplete or inaccurate data provided by me or lack of visual representation. I understand that the practice of medicine is not an exact science and that Practitioner makes no warranties or guarantees regarding treatment outcomes. I acknowledge that I will receive a copy of this consent concurrently upon execution via email to the email address I last provided but may also request a printed copy by calling the office of [facility].    I understand that my insurance may be billed for this visit.   I have read or had this consent read to me. . I understand the contents of this consent, which adequately explains the benefits and risks of the Services being provided via telemedicine.  . I have been  provided ample opportunity to ask questions regarding this consent and the Services and have had my questions answered to my  satisfaction. . I give my informed consent for the services to be provided through the use of telemedicine in my medical care  By participating in this telemedicine visit I agree to the above.

## 2019-06-14 NOTE — Telephone Encounter (Signed)
   TELEPHONE CALL NOTE  This patient has been deemed a candidate for follow-up tele-health visit to limit community exposure during the Covid-19 pandemic. I spoke with the patient via phone to discuss instructions. The patient was advised to review the section on consent for treatment as well. The patient will receive a phone call 2-3 days prior to their E-Visit at which time consent will be verbally confirmed. A Virtual Office Visit appointment type has been scheduled for 06/17/2019 with Darylene Price FNP.  Gaylord Shih, CMA 06/14/2019 12:44 PM

## 2019-06-17 ENCOUNTER — Other Ambulatory Visit: Payer: Self-pay

## 2019-06-17 ENCOUNTER — Ambulatory Visit: Payer: BLUE CROSS/BLUE SHIELD | Attending: Family | Admitting: Family

## 2019-06-17 DIAGNOSIS — I5032 Chronic diastolic (congestive) heart failure: Secondary | ICD-10-CM

## 2019-06-17 DIAGNOSIS — I10 Essential (primary) hypertension: Secondary | ICD-10-CM

## 2019-06-17 DIAGNOSIS — G4733 Obstructive sleep apnea (adult) (pediatric): Secondary | ICD-10-CM

## 2019-06-17 NOTE — Patient Instructions (Signed)
Continue weighing daily and call for an overnight weight gain of > 2 pounds or a weekly weight gain of >5 pounds. 

## 2019-06-17 NOTE — Progress Notes (Signed)
Virtual Visit via Telephone Note   Evaluation Performed:  Follow-up visit  This visit type was conducted due to national recommendations for restrictions regarding the COVID-19 Pandemic (e.g. social distancing).  This format is felt to be most appropriate for this patient at this time.  All issues noted in this document were discussed and addressed.  No physical exam was performed (except for noted visual exam findings with Video Visits).  Please refer to the patient's chart (MyChart message for video visits and phone note for telephone visits) for the patient's consent to telehealth for [facility]  Date:  06/17/2019   ID:  Christine Oneill, DOB [birth year], MRN [id number]  Patient Location:  [address] [place] [address]   Provider location:   [facility] [place] [address], [place] [address]  PCP:  [facility] [place]  Cardiologist:  Serafina Royals, MD Electrophysiologist:  None   Chief Complaint:  fatigue  History of Present Illness:    Christine Oneill is a 58 y.o. female who presents via audio/video conferencing for a telehealth visit today.  Patient verified DOB and address.  The patient does not have symptoms concerning for COVID-19 infection (fever, chills, cough, or new SHORTNESS OF BREATH).   Patient reports minimal fatigue upon moderate exertion. She says that this has been chronic in nature having been present for several years. She has associated difficulty sleeping and left lower leg/ ankle pain due to recent bout of sciatica. She denies any dizziness, pedal edema, abdominal distention, palpitations, chest pain, shortness of breath, cough or weight gain.   Prior CV studies:   The following studies were reviewed today:  Echo report from 12/26/17 reviewed and showed an EF of 60-65% along with moderate TR and trivial AR.   Past Medical History:  Diagnosis Date  . Abnormal uterine bleeding (AUB)   . Asthma   .  CHF (congestive heart failure) ([place])   . COPD (chronic obstructive pulmonary disease) ([place])   . Hypertension   . Obstructive sleep apnea    with CPAP  . Pulmonary HTN ([place])    Past Surgical History:  Procedure Laterality Date  . APPENDECTOMY    . CESAREAN SECTION    . COLPOSCOPY N/A 04/30/2018   Procedure: COLPOSCOPY;  Surgeon: Rubie Maid, MD;  Location: [facility] ORS;  Service: Gynecology;  Laterality: N/A;  . HYSTEROSCOPY W/D&C N/A 04/30/2018   Procedure: DILATATION AND CURETTAGE /HYSTEROSCOPY;  Surgeon: Rubie Maid, MD;  Location: [facility] ORS;  Service: Gynecology;  Laterality: N/A;     Current Meds  Medication Sig  . albuterol (PROVENTIL HFA;VENTOLIN HFA) 108 (90 Base) MCG/ACT inhaler Inhale 2 puffs into the lungs every 6 (six) hours as needed for wheezing or shortness of breath.  Marland Kitchen albuterol (PROVENTIL) (2.5 MG/3ML) 0.083% nebulizer solution Take 3 mLs (2.5 mg total) by nebulization every 6 (six) hours as needed for wheezing or shortness of breath.  . furosemide (LASIX) 40 MG tablet Take 1 tablet (40 mg total) by mouth daily.  Marland Kitchen HYDROcodone-acetaminophen (NORCO/VICODIN) 5-325 MG tablet Take 1 tablet by mouth every 6 (six) hours as needed for severe pain.  Marland Kitchen ibuprofen (ADVIL) 800 MG tablet TAKE 1 TABLET BY MOUTH EVERY 8 HOURS AS NEEDED FOR MODERATE PAIN.  . metolazone (ZAROXOLYN) 2.5 MG tablet Take once per week, every Monday.  . metoprolol succinate (TOPROL-XL) 25 MG 24 hr tablet Take 0.5 tablets (12.5 mg total) by mouth daily. Hold if SBP<105  . predniSONE (  STERAPRED UNI-PAK 21 TAB) 10 MG (21) TBPK tablet Take 6 tabs the the 1st day. Take 6 tabs the the 2nd day. Take 5 tabs the the 3rd day. Take 5 tabs the 4th day. Take 4 tabs the the 5th day.Take 4 tabs the the 6th day.Take 3 tabs the 7th day.Take 3 tabs the 8th day. Take 2 tabs the 9th day. Take 2 tabs the 10th day. Take 1 tab the 11th day. Take 1 tab the 12th day.     Allergies:   Patient has no known allergies.   Social History    Tobacco Use  . Smoking status: Former Smoker    Packs/day: 1.00    Types: Cigarettes    Quit date: 12/25/2017    Years since quitting: 1.4  . Smokeless tobacco: Never Used  Substance Use Topics  . Alcohol use: No  . Drug use: No     Family Hx: The patient's family history includes Hypertension in her sister; Kidney failure in her father; Lung cancer in her mother.  ROS:   Please see the history of present illness.     All other systems reviewed and are negative.   Labs/Other Tests and Data Reviewed:    Recent Labs: 06/07/2019: ALT 17; BUN 23; Creatinine, Ser 0.93; Hemoglobin 13.3; Platelets 318; Potassium 4.5; Sodium 139   Recent Lipid Panel No results found for: CHOL, TRIG, HDL, CHOLHDL, LDLCALC, LDLDIRECT  Wt Readings from Last 3 Encounters:  06/07/19 295 lb (133.8 kg)  01/01/19 (!) 309 lb 3.2 oz (140.3 kg)  12/17/18 (!) 311 lb (141.1 kg)     Exam:    Vital Signs:  There were no vitals taken for this visit.   Well nourished, well developed female in no  acute distress.   ASSESSMENT & PLAN:    1. Chronic heart failure with preserved ejection fraction- - NYHA class II - euvolemic today based on patient's description of symptoms - weighing daily but hasn't weighed yet today. Reminded to call for an overnight weight gain of >2 pounds or a weekly weight gain of >5 pounds - not adding salt and has been reading food labels for sodium content. Reminded to keep daily sodium intake to 2000mg . - BNP on 04/08/18 was 23.0 - saw cardiology Nehemiah Massed) 04/03/18  2: HTN- - not checking BP at home - saw PCP at [facility] and is awaiting a call back from them about scheduling an appointment - BMP on 06/07/2019 reviewed and showed sodium 139, potassium 4.5, creatinine 0.93 and GFR >60  3: Obstructive sleep apnea- - wearing CPAP nightly  - wearing oxygen at 2L upon exertion, bedtime and PRN at rest - saw pulmonologist Ashby Dawes) 01/01/2019  COVID-19  Education: The signs and symptoms of COVID-19 were discussed with the patient and how to seek care for testing (follow up with PCP or arrange E-visit).  The importance of social distancing was discussed today.  Patient Risk:   After full review of this patients clinical status, I feel that they are at least moderate risk at this time.  Time:   Today, I have spent 7 minutes with the patient with telehealth technology discussing diet and symptoms to report.     Medication Adjustments/Labs and Tests Ordered: Current medicines are reviewed at length with the patient today.  Concerns regarding medicines are outlined above.   Tests Ordered: No orders of the defined types were placed in this encounter.  Medication Changes: No orders of the defined types were placed  in this encounter.   Disposition:  Follow-up in 6 months or sooner for any questions/problems before then.   Signed, Alisa Graff, FNP  06/17/2019 10:05 AM    [facility]

## 2019-06-18 ENCOUNTER — Other Ambulatory Visit: Payer: Self-pay

## 2019-06-18 ENCOUNTER — Emergency Department
Admission: EM | Admit: 2019-06-18 | Discharge: 2019-06-18 | Disposition: A | Discharge status: Home / Self Care | Payer: BLUE CROSS/BLUE SHIELD | Attending: Emergency Medicine | Admitting: Emergency Medicine

## 2019-06-18 ENCOUNTER — Emergency Department: Payer: BLUE CROSS/BLUE SHIELD

## 2019-06-18 DIAGNOSIS — R079 Chest pain, unspecified: Secondary | ICD-10-CM | POA: Diagnosis present

## 2019-06-18 DIAGNOSIS — M5432 Sciatica, left side: Secondary | ICD-10-CM | POA: Insufficient documentation

## 2019-06-18 DIAGNOSIS — Z79899 Other long term (current) drug therapy: Secondary | ICD-10-CM | POA: Diagnosis not present

## 2019-06-18 DIAGNOSIS — I509 Heart failure, unspecified: Secondary | ICD-10-CM | POA: Diagnosis not present

## 2019-06-18 DIAGNOSIS — Z87891 Personal history of nicotine dependence: Secondary | ICD-10-CM | POA: Insufficient documentation

## 2019-06-18 DIAGNOSIS — J449 Chronic obstructive pulmonary disease, unspecified: Secondary | ICD-10-CM | POA: Diagnosis not present

## 2019-06-18 DIAGNOSIS — J45909 Unspecified asthma, uncomplicated: Secondary | ICD-10-CM | POA: Insufficient documentation

## 2019-06-18 DIAGNOSIS — I110 Hypertensive heart disease with heart failure: Secondary | ICD-10-CM | POA: Insufficient documentation

## 2019-06-18 LAB — CBC
HCT: 46.2 % — ABNORMAL HIGH (ref 36.0–46.0)
Hemoglobin: 14.4 g/dL (ref 12.0–15.0)
MCH: 28.7 pg (ref 26.0–34.0)
MCHC: 31.2 g/dL (ref 30.0–36.0)
MCV: 92.2 fL (ref 80.0–100.0)
Platelets: 327 10*3/uL (ref 150–400)
RBC: 5.01 MIL/uL (ref 3.87–5.11)
RDW: 15.8 % — ABNORMAL HIGH (ref 11.5–15.5)
WBC: 10.4 10*3/uL (ref 4.0–10.5)
nRBC: 0 % (ref 0.0–0.2)

## 2019-06-18 LAB — BASIC METABOLIC PANEL
Anion gap: 12 (ref 5–15)
BUN: 30 mg/dL — ABNORMAL HIGH (ref 6–20)
CO2: 28 mmol/L (ref 22–32)
Calcium: 9.4 mg/dL (ref 8.9–10.3)
Chloride: 99 mmol/L (ref 98–111)
Creatinine, Ser: 1.05 mg/dL — ABNORMAL HIGH (ref 0.44–1.00)
GFR calc Af Amer: >60 mL/min (ref >60)
GFR calc non Af Amer: 59 mL/min — ABNORMAL LOW (ref >60)
Glucose, Bld: 126 mg/dL — ABNORMAL HIGH (ref 70–99)
Potassium: 3.7 mmol/L (ref 3.5–5.1)
Sodium: 139 mmol/L (ref 135–145)

## 2019-06-18 LAB — BRAIN NATRIURETIC PEPTIDE: B Natriuretic Peptide: 13 pg/mL (ref 0.0–100.0)

## 2019-06-18 LAB — TROPONIN I (HIGH SENSITIVITY)
Troponin I (High Sensitivity): <2 ng/L (ref <18)
Troponin I (High Sensitivity): 3 ng/L (ref <18)

## 2019-06-18 MED ORDER — SODIUM CHLORIDE 0.9% FLUSH: 3.0000 mL | Freq: Once | INTRAVENOUS | Status: DC

## 2019-06-18 MED ORDER — METHOCARBAMOL 500 MG PO TABS: 1000.0000 mg | ORAL_TABLET | Freq: Three times a day (TID) | ORAL | 0 refills | Status: AC

## 2019-06-18 MED ORDER — METHOCARBAMOL 500 MG PO TABS
1000.0000 mg | ORAL_TABLET | Freq: Once | ORAL | Status: AC
  Administered 2019-06-18: 1000 mg via ORAL
  Filled 2019-06-18 (×2): qty 2

## 2019-06-18 NOTE — ED Provider Notes (Signed)
[facility] Emergency Department Provider Note   ____________________________________________   I have reviewed the triage vital signs and the nursing notes.   HISTORY  Chief Complaint Chest pain  History limited by: Not Limited   HPI Christine Oneill is a 58 y.o. female who presents to the emergency department today because of an episode of chest pain that occurred while she was at work today. The patient states that she recently was placed on steroids because of left sided sciatica. She has noticed some swelling since starting the medication, primarily on the left side. The patient today she had some nausea. She then developed some chest pain. Located in the center of her chest. Felt hot and flushed. She states that at the time of my exam the chest pain was gone.    Records reviewed. Per medical record review patient has a history of CHF, COPD. ED visit for left sided low back and leg pain roughly 10 days ago.   Past Medical History:  Diagnosis Date  . Abnormal uterine bleeding (AUB)   . Asthma   . CHF (congestive heart failure) ([place])   . COPD (chronic obstructive pulmonary disease) ([place])   . Hypertension   . Obstructive sleep apnea    with CPAP  . Pulmonary HTN [facility])     Patient Active Problem List   Diagnosis Date Noted  . HTN (hypertension) 01/04/2018  . Obstructive sleep apnea 01/04/2018  . Lymphedema 01/04/2018  . Pulmonary hypertension ([place]) 12/26/2017  . Obesity hypoventilation syndrome ([place]) 12/26/2017  . Smoker 12/26/2017  . CHF (congestive heart failure) ([place]) 12/25/2017    Past Surgical History:  Procedure Laterality Date  . APPENDECTOMY    . CESAREAN SECTION    . COLPOSCOPY N/A 04/30/2018   Procedure: COLPOSCOPY;  Surgeon: Rubie Maid, MD;  Location: [facility] ORS;  Service: Gynecology;  Laterality: N/A;  . HYSTEROSCOPY W/D&C N/A 04/30/2018   Procedure: DILATATION AND CURETTAGE /HYSTEROSCOPY;  Surgeon: Rubie Maid, MD;  Location: [facility]  ORS;  Service: Gynecology;  Laterality: N/A;    Prior to Admission medications   Medication Sig Start Date End Date Taking? Authorizing Provider  albuterol (PROVENTIL HFA;VENTOLIN HFA) 108 (90 Base) MCG/ACT inhaler Inhale 2 puffs into the lungs every 6 (six) hours as needed for wheezing or shortness of breath. 12/29/17   Fritzi Mandes, MD  albuterol (PROVENTIL) (2.5 MG/3ML) 0.083% nebulizer solution Take 3 mLs (2.5 mg total) by nebulization every 6 (six) hours as needed for wheezing or shortness of breath. 12/29/17   Fritzi Mandes, MD  furosemide (LASIX) 40 MG tablet Take 1 tablet (40 mg total) by mouth daily. 12/29/17   Fritzi Mandes, MD  HYDROcodone-acetaminophen (NORCO/VICODIN) 5-325 MG tablet Take 1 tablet by mouth every 6 (six) hours as needed for severe pain. 04/30/18   Rubie Maid, MD  ibuprofen (ADVIL) 800 MG tablet TAKE 1 TABLET BY MOUTH EVERY 8 HOURS AS NEEDED FOR MODERATE PAIN. 05/28/19   Rubie Maid, MD  megestrol (MEGACE) 40 MG tablet Take 1 tablet (40 mg total) by mouth 2 (two) times daily. Take one tablet three times a day for 3 days, then one tablet twice daily for 3 days, then one tablet daily 05/08/18   Rubie Maid, MD  metolazone (ZAROXOLYN) 2.5 MG tablet Take once per week, every Monday. 12/28/18   Laverle Hobby, MD  metoprolol succinate (TOPROL-XL) 25 MG 24 hr tablet Take 0.5 tablets (12.5 mg total) by mouth daily. Hold if SBP<105 12/30/17   Fritzi Mandes, MD  predniSONE (  STERAPRED UNI-PAK 21 TAB) 10 MG (21) TBPK tablet Take 6 tabs the the 1st day. Take 6 tabs the the 2nd day. Take 5 tabs the the 3rd day. Take 5 tabs the 4th day. Take 4 tabs the the 5th day.Take 4 tabs the the 6th day.Take 3 tabs the 7th day.Take 3 tabs the 8th day. Take 2 tabs the 9th day. Take 2 tabs the 10th day. Take 1 tab the 11th day. Take 1 tab the 12th day. 06/07/19   Lannie Fields, PA-C    Allergies Patient has no known allergies.  Family History  Problem Relation Age of Onset  . Lung cancer Mother    . Kidney failure Father   . Hypertension Sister     Social History Social History   Tobacco Use  . Smoking status: Former Smoker    Packs/day: 1.00    Types: Cigarettes    Quit date: 12/25/2017    Years since quitting: 1.4  . Smokeless tobacco: Never Used  Substance Use Topics  . Alcohol use: No  . Drug use: No    Review of Systems Constitutional: No fever/chills Eyes: No visual changes. ENT: No sore throat. Cardiovascular: Positive chest pain. Respiratory: Positive shortness of breath. Gastrointestinal: No abdominal pain.  No nausea, no vomiting.  No diarrhea.   Genitourinary: Negative for dysuria. Musculoskeletal: Positive for leg swelling, left leg pain. Skin: Negative for rash. Neurological: Negative for headaches, focal weakness or numbness.  ____________________________________________   PHYSICAL EXAM:  VITAL SIGNS: ED Triage Vitals  Enc Vitals Group     BP 06/18/19 1616 115/65     Pulse Rate 06/18/19 1616 95     Resp 06/18/19 1616 19     Temp 06/18/19 1616 99.3 F (37.4 C)     Temp Source 06/18/19 1616 Oral     SpO2 06/18/19 1616 94 %     Weight 06/18/19 1612 300 lb (136.1 kg)     Height 06/18/19 1612 5\' 3"  (1.6 m)     Head Circumference --      Peak Flow --      Pain Score 06/18/19 1612 5   Constitutional: Alert and oriented.  Eyes: Conjunctivae are normal.  ENT      Head: Normocephalic and atraumatic.      Nose: No congestion/rhinnorhea.      Mouth/Throat: Mucous membranes are moist.      Neck: No stridor. Hematological/Lymphatic/Immunilogical: No cervical lymphadenopathy. Cardiovascular: Normal rate, regular rhythm.  No murmurs, rubs, or gallops. Respiratory: Normal respiratory effort without tachypnea nor retractions. Breath sounds are clear and equal bilaterally. No wheezes/rales/rhonchi. Gastrointestinal: Soft and non tender. No rebound. No guarding.  Genitourinary: Deferred Musculoskeletal: Normal range of motion in all extremities.  Bilateral lower extremity edema. No erythema. No warmth.  Neurologic:  Normal speech and language. No gross focal neurologic deficits are appreciated.  Skin:  Skin is warm, dry and intact. No rash noted. Psychiatric: Mood and affect are normal. Speech and behavior are normal. Patient exhibits appropriate insight and judgment.  ____________________________________________    LABS (pertinent positives/negatives)  BNP 13 Trop hs <2 CBC wbc 10.4, hgb 14.4, plt 327 BMP na 139, k 3.7, glu 126, cr 1.05  ____________________________________________   EKG  I, Nance Pear, attending physician, personally viewed and interpreted this EKG  EKG Time: 1629 Rate: 95 Rhythm: normal sinus rhythm Axis: normal Intervals: qtc 434 QRS: narrow ST changes: no st elevation Impression: normal ekg   ____________________________________________    RADIOLOGY  CXR  No edema or consolidation  ____________________________________________   PROCEDURES  Procedures  ____________________________________________   INITIAL IMPRESSION / ASSESSMENT AND PLAN / ED COURSE  Pertinent labs & imaging results that were available during my care of the patient were reviewed by me and considered in my medical decision making (see chart for details).   Presented to the emergency department after an episode of chest pain that occurred at work.  Differential would be broad including pneumonia, pneumothorax, ACS, aortic disease, gastritis, costochondritis amongst other etiologies.  Troponin negative x2.  Chest x-ray without concerning findings.  Patient did recently finish a course of steroids and I do wonder if this is contributing to some of the edema.  We did also discuss sciatica.  Will trial patient on muscle relaxers.   ____________________________________________   FINAL CLINICAL IMPRESSION(S) / ED DIAGNOSES  Final diagnoses:  Sciatica of left side     Note: This dictation was prepared with  Dragon dictation. Any transcriptional errors that result from this process are unintentional     Nance Pear, MD 06/18/19 [phone]

## 2019-06-18 NOTE — ED Triage Notes (Addendum)
Pt comes via EMS from home with c/o SOB and fluid retention. Pt states she was recently dx with CHF and placed on steroids for her sciatic nerve. Pt states since she has had gained pain and become SOB.  Pt states while at work earlier she felt SOB and shakey. Pt states little chest pain. Pt also states swelling to bilateral lower extremities. Pt states she has also been wearing 2L of O2 at home when she is doing things and at night with her CPAP.

## 2019-06-18 NOTE — Discharge Instructions (Addendum)
Please seek medical attention for any high fevers, chest pain, shortness of breath, change in behavior, persistent vomiting, bloody stool or any other new or concerning symptoms.  

## 2019-06-18 NOTE — ED Notes (Signed)
Patient is resting comfortably. 

## 2019-06-18 NOTE — ED Notes (Signed)
ED Provider at bedside. 

## 2019-06-18 NOTE — ED Notes (Signed)
Pt watching tv.  No chest pain or sob.  Pt on telephone

## 2019-06-18 NOTE — ED Notes (Addendum)
Pt reports left leg pain.  Pt treated last week for sciatica and took steroids.  Pt continues to have pain in left leg and has nausea today.  Pt alert  Speech clear.  Pt denies chest pain or sob at this time.

## 2019-06-20 ENCOUNTER — Other Ambulatory Visit: Payer: Self-pay | Admitting: Obstetrics and Gynecology

## 2019-06-20 NOTE — Telephone Encounter (Signed)
Spoke with pt concerning the refill requested pt stated that she did not request for the refill.

## 2019-06-20 NOTE — Telephone Encounter (Signed)
Please see how often patient is taking this medication. This was just refilled 3 weeks ago. I am concerned if she is going through this much Ibuprofen as it can cause gastric ulcers with long term use.

## 2019-07-20 ENCOUNTER — Other Ambulatory Visit: Payer: Self-pay | Admitting: Obstetrics and Gynecology

## 2019-07-24 ENCOUNTER — Telehealth: Payer: Self-pay | Admitting: Obstetrics and Gynecology

## 2019-07-24 NOTE — Telephone Encounter (Signed)
Patient called requesting a refill on ibuprofen.Thanks

## 2019-07-25 MED ORDER — IBUPROFEN 800 MG PO TABS: ORAL_TABLET | ORAL | 0 refills | Status: DC

## 2019-07-25 NOTE — Telephone Encounter (Signed)
Pt called and informed that it has been a year since she has seen AC. Pt stated that she is having problems with her back again. Pt was advised that she could call her PCP to asked for ibuprofen or she could purchase OTC ibuprofen 200 mg and take 4 tablets every 8 hours would the same as the 800 mg prescribed. Pt voiced that she understood. Pt was informed that the ibuprofen was refilled but pt will need to follow up with [facility] before anymore refills would be given. Pt voiced that she understood.

## 2019-08-03 ENCOUNTER — Other Ambulatory Visit: Payer: Self-pay

## 2019-08-03 ENCOUNTER — Emergency Department: Payer: BLUE CROSS/BLUE SHIELD

## 2019-08-03 ENCOUNTER — Inpatient Hospital Stay
Admission: EM | Admit: 2019-08-03 | Discharge: 2019-08-11 | DRG: 291 | Disposition: A | Discharge status: Home / Self Care | Payer: BLUE CROSS/BLUE SHIELD | Attending: Internal Medicine | Admitting: Internal Medicine

## 2019-08-03 DIAGNOSIS — K5900 Constipation, unspecified: Secondary | ICD-10-CM | POA: Diagnosis not present

## 2019-08-03 DIAGNOSIS — T502X5A Adverse effect of carbonic-anhydrase inhibitors, benzothiadiazides and other diuretics, initial encounter: Secondary | ICD-10-CM | POA: Diagnosis not present

## 2019-08-03 DIAGNOSIS — M5410 Radiculopathy, site unspecified: Secondary | ICD-10-CM | POA: Diagnosis present

## 2019-08-03 DIAGNOSIS — I509 Heart failure, unspecified: Secondary | ICD-10-CM

## 2019-08-03 DIAGNOSIS — K567 Ileus, unspecified: Secondary | ICD-10-CM

## 2019-08-03 DIAGNOSIS — I272 Pulmonary hypertension, unspecified: Secondary | ICD-10-CM | POA: Diagnosis present

## 2019-08-03 DIAGNOSIS — Z888 Allergy status to other drugs, medicaments and biological substances status: Secondary | ICD-10-CM

## 2019-08-03 DIAGNOSIS — Z79899 Other long term (current) drug therapy: Secondary | ICD-10-CM

## 2019-08-03 DIAGNOSIS — I110 Hypertensive heart disease with heart failure: Principal | ICD-10-CM | POA: Diagnosis present

## 2019-08-03 DIAGNOSIS — Z20828 Contact with and (suspected) exposure to other viral communicable diseases: Secondary | ICD-10-CM | POA: Diagnosis present

## 2019-08-03 DIAGNOSIS — Z801 Family history of malignant neoplasm of trachea, bronchus and lung: Secondary | ICD-10-CM

## 2019-08-03 DIAGNOSIS — R0602 Shortness of breath: Secondary | ICD-10-CM | POA: Diagnosis not present

## 2019-08-03 DIAGNOSIS — Z9049 Acquired absence of other specified parts of digestive tract: Secondary | ICD-10-CM

## 2019-08-03 DIAGNOSIS — E662 Morbid (severe) obesity with alveolar hypoventilation: Secondary | ICD-10-CM | POA: Diagnosis present

## 2019-08-03 DIAGNOSIS — Z841 Family history of disorders of kidney and ureter: Secondary | ICD-10-CM

## 2019-08-03 DIAGNOSIS — I959 Hypotension, unspecified: Secondary | ICD-10-CM | POA: Diagnosis not present

## 2019-08-03 DIAGNOSIS — J449 Chronic obstructive pulmonary disease, unspecified: Secondary | ICD-10-CM | POA: Diagnosis present

## 2019-08-03 DIAGNOSIS — Z23 Encounter for immunization: Secondary | ICD-10-CM

## 2019-08-03 DIAGNOSIS — M5432 Sciatica, left side: Secondary | ICD-10-CM | POA: Diagnosis present

## 2019-08-03 DIAGNOSIS — J9620 Acute and chronic respiratory failure, unspecified whether with hypoxia or hypercapnia: Secondary | ICD-10-CM | POA: Diagnosis present

## 2019-08-03 DIAGNOSIS — Z6841 Body Mass Index (BMI) 40.0 and over, adult: Secondary | ICD-10-CM

## 2019-08-03 DIAGNOSIS — I5033 Acute on chronic diastolic (congestive) heart failure: Secondary | ICD-10-CM | POA: Diagnosis present

## 2019-08-03 DIAGNOSIS — Z87891 Personal history of nicotine dependence: Secondary | ICD-10-CM

## 2019-08-03 DIAGNOSIS — Z8249 Family history of ischemic heart disease and other diseases of the circulatory system: Secondary | ICD-10-CM

## 2019-08-03 DIAGNOSIS — N179 Acute kidney failure, unspecified: Secondary | ICD-10-CM | POA: Diagnosis not present

## 2019-08-03 DIAGNOSIS — Z791 Long term (current) use of non-steroidal anti-inflammatories (NSAID): Secondary | ICD-10-CM

## 2019-08-03 DIAGNOSIS — E876 Hypokalemia: Secondary | ICD-10-CM | POA: Diagnosis not present

## 2019-08-03 LAB — COMPREHENSIVE METABOLIC PANEL
ALT: 155 U/L — ABNORMAL HIGH (ref 0–44)
AST: 116 U/L — ABNORMAL HIGH (ref 15–41)
Albumin: 3.2 g/dL — ABNORMAL LOW (ref 3.5–5.0)
Alkaline Phosphatase: 126 U/L (ref 38–126)
Anion gap: 8 (ref 5–15)
BUN: 17 mg/dL (ref 6–20)
CO2: 27 mmol/L (ref 22–32)
Calcium: 9.2 mg/dL (ref 8.9–10.3)
Chloride: 104 mmol/L (ref 98–111)
Creatinine, Ser: 0.79 mg/dL (ref 0.44–1.00)
GFR calc Af Amer: >60 mL/min (ref >60)
GFR calc non Af Amer: >60 mL/min (ref >60)
Glucose, Bld: 132 mg/dL — ABNORMAL HIGH (ref 70–99)
Potassium: 3.7 mmol/L (ref 3.5–5.1)
Sodium: 139 mmol/L (ref 135–145)
Total Bilirubin: 0.3 mg/dL (ref 0.3–1.2)
Total Protein: 7.4 g/dL (ref 6.5–8.1)

## 2019-08-03 LAB — CBC WITH DIFFERENTIAL/PLATELET
Abs Immature Granulocytes: 0.31 10*3/uL — ABNORMAL HIGH (ref 0.00–0.07)
Basophils Absolute: 0.1 10*3/uL (ref 0.0–0.1)
Basophils Relative: 1 %
Eosinophils Absolute: 0.2 10*3/uL (ref 0.0–0.5)
Eosinophils Relative: 2 %
HCT: 41.9 % (ref 36.0–46.0)
Hemoglobin: 13.3 g/dL (ref 12.0–15.0)
Immature Granulocytes: 4 %
Lymphocytes Relative: 18 %
Lymphs Abs: 1.4 10*3/uL (ref 0.7–4.0)
MCH: 28.5 pg (ref 26.0–34.0)
MCHC: 31.7 g/dL (ref 30.0–36.0)
MCV: 89.9 fL (ref 80.0–100.0)
Monocytes Absolute: 0.5 10*3/uL (ref 0.1–1.0)
Monocytes Relative: 7 %
Neutro Abs: 5.2 10*3/uL (ref 1.7–7.7)
Neutrophils Relative %: 68 %
Platelets: 201 10*3/uL (ref 150–400)
RBC: 4.66 MIL/uL (ref 3.87–5.11)
RDW: 15.7 % — ABNORMAL HIGH (ref 11.5–15.5)
WBC: 7.6 10*3/uL (ref 4.0–10.5)
nRBC: 0.3 % — ABNORMAL HIGH (ref 0.0–0.2)

## 2019-08-03 LAB — BRAIN NATRIURETIC PEPTIDE: B Natriuretic Peptide: 36 pg/mL (ref 0.0–100.0)

## 2019-08-03 LAB — TROPONIN I (HIGH SENSITIVITY): Troponin I (High Sensitivity): 8 ng/L (ref <18)

## 2019-08-03 NOTE — ED Triage Notes (Signed)
Patient reports CHF diagnosis in January.  Has noticed weight gain and hurts to breath and stomach feels tight.  Patient normally on O2 at all times but tank ran out on way to the ED.

## 2019-08-03 NOTE — ED Provider Notes (Signed)
[facility] Emergency Department Provider Note  ____________________________________________   First MD Initiated Contact with Patient 08/03/19 2318     (approximate)  I have reviewed the triage vital signs and the nursing notes.   HISTORY  Chief Complaint Shortness of Breath    HPI Christine Oneill is a 58 y.o. female whose medical history includes both COPD and CHF.  She sees Darylene Price at the heart failure clinic and uses oxygen more times than not during the day.  She reports that for the last 24+ hours she has been gradually becoming more short of breath.  Exertion makes it worse.  Her legs are swelling and she feels like her abdomen is swelling as well.  Is getting harder to take a deep breath.  She has had no cough, sore throat, fever/chills, nausea, vomiting, chest pain, abdominal pain, or dysuria.  She has not been around anyone known to have COVID-19.  She is having some back pain that is acute on chronic.  She reports that she called the heart failure center and spoke with Ms. Hackney yesterday and she told her to continue her medications and follow her weight.  Over the last 24 hours she has put on 10 pounds.  Nothing in particular makes the symptoms better and they are severe.         Past Medical History:  Diagnosis Date  . Abnormal uterine bleeding (AUB)   . Asthma   . CHF (congestive heart failure) ([place])   . COPD (chronic obstructive pulmonary disease) ([place])   . Hypertension   . Obstructive sleep apnea    with CPAP  . Pulmonary HTN [facility])     Patient Active Problem List   Diagnosis Date Noted  . HTN (hypertension) 01/04/2018  . Obstructive sleep apnea 01/04/2018  . Lymphedema 01/04/2018  . Pulmonary hypertension ([place]) 12/26/2017  . Obesity hypoventilation syndrome ([place]) 12/26/2017  . Smoker 12/26/2017  . CHF (congestive heart failure) ([place]) 12/25/2017    Past Surgical History:  Procedure Laterality Date  . APPENDECTOMY    .  CESAREAN SECTION    . COLPOSCOPY N/A 04/30/2018   Procedure: COLPOSCOPY;  Surgeon: Rubie Maid, MD;  Location: [facility] ORS;  Service: Gynecology;  Laterality: N/A;  . HYSTEROSCOPY W/D&C N/A 04/30/2018   Procedure: DILATATION AND CURETTAGE /HYSTEROSCOPY;  Surgeon: Rubie Maid, MD;  Location: [facility] ORS;  Service: Gynecology;  Laterality: N/A;    Prior to Admission medications   Medication Sig Start Date End Date Taking? Authorizing Provider  albuterol (PROVENTIL HFA;VENTOLIN HFA) 108 (90 Base) MCG/ACT inhaler Inhale 2 puffs into the lungs every 6 (six) hours as needed for wheezing or shortness of breath. 12/29/17   Fritzi Mandes, MD  albuterol (PROVENTIL) (2.5 MG/3ML) 0.083% nebulizer solution Take 3 mLs (2.5 mg total) by nebulization every 6 (six) hours as needed for wheezing or shortness of breath. 12/29/17   Fritzi Mandes, MD  furosemide (LASIX) 40 MG tablet Take 1 tablet (40 mg total) by mouth daily. 12/29/17   Fritzi Mandes, MD  HYDROcodone-acetaminophen (NORCO/VICODIN) 5-325 MG tablet Take 1 tablet by mouth every 6 (six) hours as needed for severe pain. 04/30/18   Rubie Maid, MD  ibuprofen (ADVIL) 800 MG tablet TAKE 1 TABLET BY MOUTH EVERY 8 HOURS AS NEEDED FOR MODERATE PAIN. 07/25/19   Rubie Maid, MD  metolazone (ZAROXOLYN) 2.5 MG tablet Take once per week, every Monday. 12/28/18   Laverle Hobby, MD  metoprolol succinate (TOPROL-XL) 25 MG 24 hr tablet Take 0.5  tablets (12.5 mg total) by mouth daily. Hold if SBP<105 12/30/17   Fritzi Mandes, MD  predniSONE (STERAPRED UNI-PAK 21 TAB) 10 MG (21) TBPK tablet Take 6 tabs the the 1st day. Take 6 tabs the the 2nd day. Take 5 tabs the the 3rd day. Take 5 tabs the 4th day. Take 4 tabs the the 5th day.Take 4 tabs the the 6th day.Take 3 tabs the 7th day.Take 3 tabs the 8th day. Take 2 tabs the 9th day. Take 2 tabs the 10th day. Take 1 tab the 11th day. Take 1 tab the 12th day. 06/07/19   Lannie Fields, PA-C    Allergies Prednisone  Family History   Problem Relation Age of Onset  . Lung cancer Mother   . Kidney failure Father   . Hypertension Sister     Social History Social History   Tobacco Use  . Smoking status: Former Smoker    Packs/day: 1.00    Types: Cigarettes    Quit date: 12/25/2017    Years since quitting: 1.6  . Smokeless tobacco: Never Used  Substance Use Topics  . Alcohol use: No  . Drug use: No    Review of Systems Constitutional: No fever/chills Eyes: No visual changes. ENT: No sore throat. Cardiovascular: Denies chest pain. Respiratory: Shortness of breath particularly with exertion.  Significant weight gain over 24 hours. Gastrointestinal: No abdominal pain.  No nausea, no vomiting.  No diarrhea.  No constipation. Genitourinary: Negative for dysuria. Musculoskeletal: Acute on chronic back pain. Integumentary: Negative for rash. Neurological: Negative for headaches, focal weakness or numbness.   ____________________________________________   PHYSICAL EXAM:  VITAL SIGNS: ED Triage Vitals [08/03/19 2300]  Enc Vitals Group     BP 138/89     Pulse Rate 89     Resp 20     Temp 100.3 F (37.9 C)     Temp Source Oral     SpO2 96 %     Weight 129.3 kg (285 lb)     Height 1.6 m (5\' 3" )     Head Circumference      Peak Flow      Pain Score 8     Pain Loc      Pain Edu?      Excl. in [place]?     Constitutional: Alert and oriented.  Appears uncomfortable but is not in severe distress. Eyes: Conjunctivae are normal.  Head: Atraumatic. Nose: No congestion/rhinnorhea. Mouth/Throat: Mucous membranes are moist. Neck: No stridor.  No meningeal signs.   Cardiovascular: Normal rate, regular rhythm. Good peripheral circulation. Grossly normal heart sounds. Respiratory: Slight increased work of breathing.  No wheezing, no retractions. Gastrointestinal: Obese.  Abdomen is tense and suggestive of a degree of anasarca but nontender to palpation. Musculoskeletal: Bilateral lower extremity edema, no evidence  of cellulitis/infection. No gross deformities of extremities. Neurologic:  Normal speech and language. No gross focal neurologic deficits are appreciated.  Skin:  Skin is warm, dry and intact. Psychiatric: Mood and affect are normal. Speech and behavior are normal.  ____________________________________________   LABS (all labs ordered are listed, but only abnormal results are displayed)  Labs Reviewed  COMPREHENSIVE METABOLIC PANEL - Abnormal; Notable for the following components:      Result Value   Glucose, Bld 132 (*)    Albumin 3.2 (*)    AST 116 (*)    ALT 155 (*)    All other components within normal limits  CBC WITH DIFFERENTIAL/PLATELET - Abnormal; Notable for  the following components:   RDW 15.7 (*)    nRBC 0.3 (*)    Abs Immature Granulocytes 0.31 (*)    All other components within normal limits  SARS CORONAVIRUS 2 (HOSPITAL ORDER, [place] [facility])  BRAIN NATRIURETIC PEPTIDE  URINALYSIS, ROUTINE W REFLEX MICROSCOPIC  TROPONIN I (HIGH SENSITIVITY)   ____________________________________________  EKG  ED ECG REPORT I, Hinda Kehr, the attending physician, personally viewed and interpreted this ECG.  Date: 08/03/2019 EKG Time: 23: 13 Rate: 89 Rhythm: normal sinus rhythm QRS Axis: normal Intervals: normal ST/T Wave abnormalities: normal Narrative Interpretation: no evidence of acute ischemia  ____________________________________________  RADIOLOGY I, Hinda Kehr, personally viewed and evaluated these images (plain radiographs) as part of my medical decision making, as well as reviewing the written report by the radiologist.  ED MD interpretation: Cardiomegaly with vascular congestion and interstitial thickening consistent with pulmonary edema  Official radiology report(s): Dg Chest Portable 1 View  Result Date: 08/03/2019 CLINICAL DATA:  Shortness of breath. History of CHF. EXAM: PORTABLE CHEST 1 VIEW COMPARISON:  Most recent  radiograph 06/18/2019 FINDINGS: Mild cardiomegaly, unchanged. Central vascular congestion. Mild interstitial thickening suspicious for pulmonary edema. Mild scarring at the left lung base. No confluent airspace disease, large pleural effusion or pneumothorax. No acute osseous abnormalities. IMPRESSION: Cardiomegaly with vascular congestion and mild interstitial thickening, suspicious for pulmonary edema. Electronically Signed   By: Keith Rake M.D.   On: 08/03/2019 23:40    ____________________________________________   PROCEDURES   Procedure(s) performed (including Critical Care):  Procedures   ____________________________________________   INITIAL IMPRESSION / MDM / [place] / ED COURSE  As part of my medical decision making, I reviewed the following data within the [place] notes reviewed and incorporated, Labs reviewed , EKG interpreted , Old chart reviewed, Radiograph reviewed , Discussed with admitting physician (Dr. Sidney Ace) and Notes from prior ED visits  Differential diagnosis includes, but is not limited to, CHF exacerbation, pneumonia, COVID-19 or other respiratory viral infection, PE.  The patient is established at the heart failure clinic and has had a substantial weight gain over the last 24 hours.  Her signs and symptoms are all consistent with CHF exacerbation.  Her vital signs are stable except for mildly elevated temperature which is of unclear significance given lack of any infectious symptoms.  Urinalysis, chest x-ray, and blood work are pending.  EKG is reassuring with no signs of ischemia.  The patient is not in significant distress although she does appear uncomfortable.  I will hold off on diuretic until I see the chest x-ray to make sure there is no obvious large infiltrate but I do believe she is volume overloaded even if she does have a concurrent infection.       Clinical Course as of Aug 03 40  Sat Aug 03, 2019  2355  B Natriuretic Peptide: 36.0 [CF]  2355 CBC with Differential/Platelet(!) [CF]  2355 DG Chest Portable 1 View [CF]  Sun Aug 04, 2019  0028 Generally reassuring CMP other than mild transaminitis of unclear significance.  Given that the patient has been having back pain, biliary disease is possible, but her alkaline phosphatase is normal and her bilirubin is normal.  Additionally, body habitus would make a right upper quadrant ultrasound very challenging.  She is not having any abdominal pain.  Comprehensive metabolic panel(!) [CF]  4627 Ordered furosemide 40 mg regularly.   [CF]  0032 SARS Coronavirus 2: NEGATIVE [CF]  0032  Send [facility] secure chat to hospitalist team for admission   [CF]  0032 Troponin I (High Sensitivity): 8 [CF]  0040 Dr. Sidney Ace acknowledged the message and will admit.  Patient stable.   [CF]    Clinical Course User Index [CF] Hinda Kehr, MD     ____________________________________________  FINAL CLINICAL IMPRESSION(S) / ED DIAGNOSES  Final diagnoses:  Acute on chronic respiratory failure, unspecified whether with hypoxia or hypercapnia (HCC)  Acute on chronic congestive heart failure, unspecified heart failure type ([place])     MEDICATIONS GIVEN DURING THIS VISIT:  Medications  furosemide (LASIX) injection 40 mg (has no administration in time range)     ED Discharge Orders    None      *Please note:  Christine Oneill was evaluated in Emergency Department on 08/04/2019 for the symptoms described in the history of present illness. She was evaluated in the context of the global COVID-19 pandemic, which necessitated consideration that the patient might be at risk for infection with the SARS-CoV-2 virus that causes COVID-19. Institutional protocols and algorithms that pertain to the evaluation of patients at risk for COVID-19 are in a state of rapid change based on information released by regulatory bodies including the CDC and federal and state organizations. These  policies and algorithms were followed during the patient's care in the ED.  Some ED evaluations and interventions may be delayed as a result of limited staffing during the pandemic.*  Note:  This document was prepared using Dragon voice recognition software and may include unintentional dictation errors.   Hinda Kehr, MD 08/04/19 [phone]

## 2019-08-04 DIAGNOSIS — Z6841 Body Mass Index (BMI) 40.0 and over, adult: Secondary | ICD-10-CM | POA: Diagnosis not present

## 2019-08-04 DIAGNOSIS — E662 Morbid (severe) obesity with alveolar hypoventilation: Secondary | ICD-10-CM | POA: Diagnosis present

## 2019-08-04 DIAGNOSIS — Z8249 Family history of ischemic heart disease and other diseases of the circulatory system: Secondary | ICD-10-CM | POA: Diagnosis not present

## 2019-08-04 DIAGNOSIS — K5900 Constipation, unspecified: Secondary | ICD-10-CM | POA: Diagnosis not present

## 2019-08-04 DIAGNOSIS — Z9049 Acquired absence of other specified parts of digestive tract: Secondary | ICD-10-CM | POA: Diagnosis not present

## 2019-08-04 DIAGNOSIS — I272 Pulmonary hypertension, unspecified: Secondary | ICD-10-CM | POA: Diagnosis present

## 2019-08-04 DIAGNOSIS — I5033 Acute on chronic diastolic (congestive) heart failure: Secondary | ICD-10-CM | POA: Diagnosis present

## 2019-08-04 DIAGNOSIS — N179 Acute kidney failure, unspecified: Secondary | ICD-10-CM | POA: Diagnosis not present

## 2019-08-04 DIAGNOSIS — Z20828 Contact with and (suspected) exposure to other viral communicable diseases: Secondary | ICD-10-CM | POA: Diagnosis present

## 2019-08-04 DIAGNOSIS — Z87891 Personal history of nicotine dependence: Secondary | ICD-10-CM | POA: Diagnosis not present

## 2019-08-04 DIAGNOSIS — Z801 Family history of malignant neoplasm of trachea, bronchus and lung: Secondary | ICD-10-CM | POA: Diagnosis not present

## 2019-08-04 DIAGNOSIS — Z888 Allergy status to other drugs, medicaments and biological substances status: Secondary | ICD-10-CM | POA: Diagnosis not present

## 2019-08-04 DIAGNOSIS — J9620 Acute and chronic respiratory failure, unspecified whether with hypoxia or hypercapnia: Secondary | ICD-10-CM | POA: Diagnosis present

## 2019-08-04 DIAGNOSIS — M5410 Radiculopathy, site unspecified: Secondary | ICD-10-CM | POA: Diagnosis present

## 2019-08-04 DIAGNOSIS — E876 Hypokalemia: Secondary | ICD-10-CM | POA: Diagnosis not present

## 2019-08-04 DIAGNOSIS — Z23 Encounter for immunization: Secondary | ICD-10-CM | POA: Diagnosis not present

## 2019-08-04 DIAGNOSIS — R0602 Shortness of breath: Secondary | ICD-10-CM | POA: Diagnosis present

## 2019-08-04 DIAGNOSIS — J449 Chronic obstructive pulmonary disease, unspecified: Secondary | ICD-10-CM | POA: Diagnosis present

## 2019-08-04 DIAGNOSIS — I110 Hypertensive heart disease with heart failure: Secondary | ICD-10-CM | POA: Diagnosis present

## 2019-08-04 DIAGNOSIS — T502X5A Adverse effect of carbonic-anhydrase inhibitors, benzothiadiazides and other diuretics, initial encounter: Secondary | ICD-10-CM | POA: Diagnosis not present

## 2019-08-04 DIAGNOSIS — Z841 Family history of disorders of kidney and ureter: Secondary | ICD-10-CM | POA: Diagnosis not present

## 2019-08-04 DIAGNOSIS — Z79899 Other long term (current) drug therapy: Secondary | ICD-10-CM | POA: Diagnosis not present

## 2019-08-04 DIAGNOSIS — M5432 Sciatica, left side: Secondary | ICD-10-CM | POA: Diagnosis present

## 2019-08-04 DIAGNOSIS — I959 Hypotension, unspecified: Secondary | ICD-10-CM | POA: Diagnosis not present

## 2019-08-04 LAB — CBC WITH DIFFERENTIAL/PLATELET
Abs Immature Granulocytes: 0.32 10*3/uL — ABNORMAL HIGH (ref 0.00–0.07)
Basophils Absolute: 0.1 10*3/uL (ref 0.0–0.1)
Basophils Relative: 1 %
Eosinophils Absolute: 0.1 10*3/uL (ref 0.0–0.5)
Eosinophils Relative: 2 %
HCT: 43.8 % (ref 36.0–46.0)
Hemoglobin: 13.7 g/dL (ref 12.0–15.0)
Immature Granulocytes: 4 %
Lymphocytes Relative: 18 %
Lymphs Abs: 1.4 10*3/uL (ref 0.7–4.0)
MCH: 28.1 pg (ref 26.0–34.0)
MCHC: 31.3 g/dL (ref 30.0–36.0)
MCV: 89.9 fL (ref 80.0–100.0)
Monocytes Absolute: 0.6 10*3/uL (ref 0.1–1.0)
Monocytes Relative: 7 %
Neutro Abs: 5.6 10*3/uL (ref 1.7–7.7)
Neutrophils Relative %: 68 %
Platelets: 203 10*3/uL (ref 150–400)
RBC: 4.87 MIL/uL (ref 3.87–5.11)
RDW: 15.7 % — ABNORMAL HIGH (ref 11.5–15.5)
WBC: 8.2 10*3/uL (ref 4.0–10.5)
nRBC: 0.4 % — ABNORMAL HIGH (ref 0.0–0.2)

## 2019-08-04 LAB — URINALYSIS, ROUTINE W REFLEX MICROSCOPIC
Bilirubin Urine: NEGATIVE
Glucose, UA: NEGATIVE mg/dL
Ketones, ur: NEGATIVE mg/dL
Leukocytes,Ua: NEGATIVE
Nitrite: NEGATIVE
Protein, ur: NEGATIVE mg/dL
Specific Gravity, Urine: 1.015 (ref 1.005–1.030)
pH: 6 (ref 5.0–8.0)

## 2019-08-04 LAB — TROPONIN I (HIGH SENSITIVITY): Troponin I (High Sensitivity): 10 ng/L (ref <18)

## 2019-08-04 LAB — MAGNESIUM: Magnesium: 1.8 mg/dL (ref 1.7–2.4)

## 2019-08-04 LAB — SARS CORONAVIRUS 2 BY RT PCR (HOSPITAL ORDER, PERFORMED IN ~~LOC~~ HOSPITAL LAB): SARS Coronavirus 2: NEGATIVE

## 2019-08-04 MED ORDER — FUROSEMIDE 10 MG/ML IJ SOLN: 20.0000 mg | Freq: Once | INTRAMUSCULAR | Status: DC

## 2019-08-04 MED ORDER — ASPIRIN EC 81 MG PO TBEC
81.0000 mg | DELAYED_RELEASE_TABLET | Freq: Every day | ORAL | Status: DC
  Administered 2019-08-04 – 2019-08-11 (×8): 81 mg via ORAL
  Filled 2019-08-04 (×8): qty 1

## 2019-08-04 MED ORDER — SODIUM CHLORIDE 0.9% FLUSH
3.0000 mL | Freq: Two times a day (BID) | INTRAVENOUS | Status: DC
  Administered 2019-08-04 – 2019-08-11 (×15): 3 mL via INTRAVENOUS

## 2019-08-04 MED ORDER — GABAPENTIN 100 MG PO CAPS
100.0000 mg | ORAL_CAPSULE | Freq: Three times a day (TID) | ORAL | Status: DC | PRN
  Administered 2019-08-06: 100 mg via ORAL
  Filled 2019-08-04: qty 1

## 2019-08-04 MED ORDER — FUROSEMIDE 10 MG/ML IJ SOLN
60.0000 mg | Freq: Two times a day (BID) | INTRAMUSCULAR | Status: DC
  Administered 2019-08-04: 60 mg via INTRAVENOUS
  Filled 2019-08-04: qty 6

## 2019-08-04 MED ORDER — ENOXAPARIN SODIUM 40 MG/0.4ML ~~LOC~~ SOLN
40.0000 mg | Freq: Two times a day (BID) | SUBCUTANEOUS | Status: DC
  Administered 2019-08-04: 40 mg via SUBCUTANEOUS
  Filled 2019-08-04: qty 0.4

## 2019-08-04 MED ORDER — METOLAZONE 2.5 MG PO TABS
2.5000 mg | ORAL_TABLET | ORAL | Status: DC
  Filled 2019-08-04: qty 1

## 2019-08-04 MED ORDER — FUROSEMIDE 10 MG/ML IJ SOLN
60.0000 mg | Freq: Two times a day (BID) | INTRAMUSCULAR | Status: DC
  Administered 2019-08-04 – 2019-08-08 (×8): 60 mg via INTRAVENOUS
  Filled 2019-08-04 (×8): qty 6

## 2019-08-04 MED ORDER — ENOXAPARIN SODIUM 40 MG/0.4ML ~~LOC~~ SOLN
40.0000 mg | Freq: Two times a day (BID) | SUBCUTANEOUS | Status: DC
  Administered 2019-08-04 – 2019-08-10 (×13): 40 mg via SUBCUTANEOUS
  Filled 2019-08-04 (×14): qty 0.4

## 2019-08-04 MED ORDER — ONDANSETRON HCL 4 MG/2ML IJ SOLN: 4.0000 mg | Freq: Four times a day (QID) | INTRAMUSCULAR | Status: DC | PRN

## 2019-08-04 MED ORDER — METOPROLOL SUCCINATE ER 25 MG PO TB24
12.5000 mg | ORAL_TABLET | Freq: Every day | ORAL | Status: DC
  Administered 2019-08-04 – 2019-08-11 (×8): 12.5 mg via ORAL
  Filled 2019-08-04 (×8): qty 1

## 2019-08-04 MED ORDER — FUROSEMIDE 10 MG/ML IJ SOLN: 40.0000 mg | Freq: Two times a day (BID) | INTRAMUSCULAR | Status: DC

## 2019-08-04 MED ORDER — ALBUTEROL SULFATE (2.5 MG/3ML) 0.083% IN NEBU: 2.5000 mg | INHALATION_SOLUTION | Freq: Four times a day (QID) | RESPIRATORY_TRACT | Status: DC | PRN

## 2019-08-04 MED ORDER — PNEUMOCOCCAL VAC POLYVALENT 25 MCG/0.5ML IJ INJ
0.5000 mL | INJECTION | INTRAMUSCULAR | Status: AC
  Administered 2019-08-05: 0.5 mL via INTRAMUSCULAR
  Filled 2019-08-04: qty 0.5

## 2019-08-04 MED ORDER — ENOXAPARIN SODIUM 40 MG/0.4ML ~~LOC~~ SOLN: 40.0000 mg | SUBCUTANEOUS | Status: DC

## 2019-08-04 MED ORDER — SODIUM CHLORIDE 0.9 % IV SOLN: 250.0000 mL | INTRAVENOUS | Status: DC | PRN

## 2019-08-04 MED ORDER — ACETAMINOPHEN 325 MG PO TABS
650.0000 mg | ORAL_TABLET | ORAL | Status: DC | PRN
  Administered 2019-08-04: 650 mg via ORAL
  Filled 2019-08-04: qty 2

## 2019-08-04 MED ORDER — SODIUM CHLORIDE 0.9% FLUSH: 3.0000 mL | INTRAVENOUS | Status: DC | PRN

## 2019-08-04 MED ORDER — FUROSEMIDE 10 MG/ML IJ SOLN
40.0000 mg | Freq: Once | INTRAMUSCULAR | Status: AC
  Administered 2019-08-04: 40 mg via INTRAVENOUS
  Filled 2019-08-04: qty 4

## 2019-08-04 MED ORDER — ZOLPIDEM TARTRATE 5 MG PO TABS: 5.0000 mg | ORAL_TABLET | Freq: Every evening | ORAL | Status: DC | PRN

## 2019-08-04 NOTE — H&P (Signed)
[facility] [place] NAME: Christine Oneill    MR#:  [id number]  DATE OF BIRTH:  1961/12/04  DATE OF ADMISSION:  08/03/2019  PRIMARY CARE PHYSICIAN: [facility] [place]   REQUESTING/REFERRING PHYSICIAN: Hinda Kehr, MD CHIEF COMPLAINT:   Chief Complaint  Patient presents with   Shortness of Breath    HISTORY OF PRESENT ILLNESS:  Christine Oneill  is a 58 y.o. African-American female with a known history of multiple medical problems that include obstructive sleep apnea on home CPAP, hypertension, diastolic CHF, obesity and left sciatica, who presented to the emergency room with acute onset of abnormal weight gain of 10 pounds within the last 24-hour as well as dyspnea over the last couple of days with associated orthopnea and worsening lower extremity edema, paroxysmal nocturnal dyspnea and dyspnea on exertion.  She admits to dry cough with occasional wheezing.  She denies any fever or chills.  She denied any chest pain or palpitations.  She denied any nausea or vomiting or abdominal pain.  No dysuria, oliguria or hematuria or flank pain.  Upon presentation to the emergency room, temperature was 100.3 with a blood pressure 138/89 pulse 89 respiratory to 20 and pulse currently 96% on room air.  Later on respiratory rate was up to 31 and heart rate 108.  Portable chest ray showed cardiomegaly with pulmonary vascular congestion or interstitial pulmonary edema consistent with acute CHF.  Labs were remarkable for elevated AST and ALT and troponin I was 8 and BNP 36.  COVID-19 test came back negative.  EKG showed normal sinus rhythm with rate of 89 with low voltage QRS.  The patient was given 40 mg of IV Lasix in the ER.  She will be admitted to a telemetry bed for further evaluation and management.  PAST MEDICAL HISTORY:   Past Medical History:  Diagnosis Date   Abnormal uterine bleeding (AUB)    Asthma    CHF (congestive heart  failure) (HCC)    COPD (chronic obstructive pulmonary disease) (HCC)    Hypertension    Obstructive sleep apnea    with CPAP   Pulmonary HTN ([place])     PAST SURGICAL HISTORY:   Past Surgical History:  Procedure Laterality Date   APPENDECTOMY     CESAREAN SECTION     COLPOSCOPY N/A 04/30/2018   Procedure: COLPOSCOPY;  Surgeon: Rubie Maid, MD;  Location: [facility] ORS;  Service: Gynecology;  Laterality: N/A;   HYSTEROSCOPY W/D&C N/A 04/30/2018   Procedure: DILATATION AND CURETTAGE /HYSTEROSCOPY;  Surgeon: Rubie Maid, MD;  Location: [facility] ORS;  Service: Gynecology;  Laterality: N/A;    SOCIAL HISTORY:   Social History   Tobacco Use   Smoking status: Former Smoker    Packs/day: 1.00    Types: Cigarettes    Quit date: 12/25/2017    Years since quitting: 1.6   Smokeless tobacco: Never Used  Substance Use Topics   Alcohol use: No    FAMILY HISTORY:   Family History  Problem Relation Age of Onset   Lung cancer Mother    Kidney failure Father    Hypertension Sister     DRUG ALLERGIES:   Allergies  Allergen Reactions   Prednisone     States had breathing difficulty on this with flushing and faint feeling.    REVIEW OF SYSTEMS:   ROS As per history of present illness. All pertinent systems were reviewed above. Constitutional,  HEENT, cardiovascular, respiratory, GI, GU, musculoskeletal,  neuro, psychiatric, endocrine,  integumentary and hematologic systems were reviewed and are otherwise  negative/unremarkable except for positive findings mentioned above in the HPI.   MEDICATIONS AT HOME:   Prior to Admission medications   Medication Sig Start Date End Date Taking? Authorizing Provider  albuterol (PROVENTIL HFA;VENTOLIN HFA) 108 (90 Base) MCG/ACT inhaler Inhale 2 puffs into the lungs every 6 (six) hours as needed for wheezing or shortness of breath. 12/29/17  Yes Fritzi Mandes, MD  albuterol (PROVENTIL) (2.5 MG/3ML) 0.083% nebulizer solution Take 3 mLs  (2.5 mg total) by nebulization every 6 (six) hours as needed for wheezing or shortness of breath. 12/29/17  Yes Fritzi Mandes, MD  furosemide (LASIX) 40 MG tablet Take 1 tablet (40 mg total) by mouth daily. 12/29/17  Yes Fritzi Mandes, MD  ibuprofen (ADVIL) 800 MG tablet TAKE 1 TABLET BY MOUTH EVERY 8 HOURS AS NEEDED FOR MODERATE PAIN. Patient taking differently: Take 800 mg by mouth daily.  07/25/19  Yes Rubie Maid, MD  metolazone (ZAROXOLYN) 2.5 MG tablet Take once per week, every Monday. 12/28/18  Yes Laverle Hobby, MD  metoprolol succinate (TOPROL-XL) 25 MG 24 hr tablet Take 0.5 tablets (12.5 mg total) by mouth daily. Hold if SBP<105 12/30/17  Yes Fritzi Mandes, MD  HYDROcodone-acetaminophen (NORCO/VICODIN) 5-325 MG tablet Take 1 tablet by mouth every 6 (six) hours as needed for severe pain. Patient not taking: Reported on 08/04/2019 04/30/18   Rubie Maid, MD  predniSONE (STERAPRED UNI-PAK 21 TAB) 10 MG (21) TBPK tablet Take 6 tabs the the 1st day. Take 6 tabs the the 2nd day. Take 5 tabs the the 3rd day. Take 5 tabs the 4th day. Take 4 tabs the the 5th day.Take 4 tabs the the 6th day.Take 3 tabs the 7th day.Take 3 tabs the 8th day. Take 2 tabs the 9th day. Take 2 tabs the 10th day. Take 1 tab the 11th day. Take 1 tab the 12th day. Patient not taking: Reported on 08/04/2019 06/07/19   Lannie Fields, PA-C      VITAL SIGNS:  Blood pressure (!) 144/87, pulse 81, temperature 100.3 F (37.9 C), temperature source Oral, resp. rate (!) 29, height 5\' 3"  (1.6 m), weight 129.3 kg, SpO2 100 %.  PHYSICAL EXAMINATION:  Physical Exam  GENERAL:  59 y.o.-year-old African-American female patient lying in the bed with no acute distress.  EYES: Pupils equal, round, reactive to light and accommodation. No scleral icterus. Extraocular muscles intact.  HEENT: Head atraumatic, normocephalic. Oropharynx and nasopharynx clear.  NECK:  Supple, no jugular venous distention. No thyroid enlargement, no tenderness.    LUNGS: Diminished bibasilar breath sounds with bibasal rales. CARDIOVASCULAR: Regular rate and rhythm, S1, S2 normal. No murmurs, rubs, or gallops.  ABDOMEN: Soft, nondistended, nontender. Bowel sounds present. No organomegaly or mass.  EXTREMITIES: 2+ bilateral lower extremity pitting edema with no clubbing or cyanosis. NEUROLOGIC: Cranial nerves II through XII are intact. Muscle strength 5/5 in all extremities. Sensation intact. Gait not checked.  PSYCHIATRIC: The patient is alert and oriented x 3.  Normal affect and good eye contact. SKIN: No obvious rash, lesion, or ulcer.   LABORATORY PANEL:   CBC Recent Labs  Lab 08/03/19 2302  WBC 7.6  HGB 13.3  HCT 41.9  PLT 201   ------------------------------------------------------------------------------------------------------------------  Chemistries  Recent Labs  Lab 08/03/19 2302  NA 139  K 3.7  CL 104  CO2 27  GLUCOSE 132*  BUN 17  CREATININE 0.79  CALCIUM 9.2  AST 116*  ALT 155*  ALKPHOS 126  BILITOT 0.3   ------------------------------------------------------------------------------------------------------------------  Cardiac Enzymes No results for input(s): TROPONINI in the last 168 hours. ------------------------------------------------------------------------------------------------------------------  RADIOLOGY:  Dg Chest Portable 1 View  Result Date: 08/03/2019 CLINICAL DATA:  Shortness of breath. History of CHF. EXAM: PORTABLE CHEST 1 VIEW COMPARISON:  Most recent radiograph 06/18/2019 FINDINGS: Mild cardiomegaly, unchanged. Central vascular congestion. Mild interstitial thickening suspicious for pulmonary edema. Mild scarring at the left lung base. No confluent airspace disease, large pleural effusion or pneumothorax. No acute osseous abnormalities. IMPRESSION: Cardiomegaly with vascular congestion and mild interstitial thickening, suspicious for pulmonary edema. Electronically Signed   By: Keith Rake  M.D.   On: 08/03/2019 23:40      IMPRESSION AND PLAN:  1.  Acute on chronic diastolic CHF. The patient will be admitted to a telemetry bed and will be diuresed with IV Lasix.  Will follow serial cardiac enzymes.  Will obtain a 2D echo(as it was not done last 6 months) and a cardiology consultation in a.m.  We will continue metolazone and Toprol-XL.Marland Kitchen  2.  Hypertension.  Toprol-XL will be continued.  3.  Left lower extremity radiculopathy with sciatica.  Neurontin will be continued on a as needed basis.  4.  Obesity hypoventilation syndrome/obstructive sleep apnea.  We will continue CPAP.  5.  History of tobacco abuse.  She quit smoking in January of 2019.  6.  DVT prophylaxis.  Subcutaneous Lovenox.  All the records are reviewed and case discussed with ED provider. The plan of care was discussed in details with the patient (and family). I answered all questions. The patient agreed to proceed with the above mentioned plan. Further management will depend upon hospital course.   CODE STATUS: Full code  TOTAL TIME TAKING CARE OF THIS PATIENT: 50 minutes.    Christel Mormon M.D on 08/04/2019 at 1:15 AM  Pager - [phone]  After 6pm go to [email or web address] - Technical brewer [facility]  Office  [phone]  CC: Primary care physician; [facility] [place]   Note: This dictation was prepared with Diplomatic Services operational officer dictation along with smaller phrase technology. Any transcriptional errors that result from this process are unintentional.

## 2019-08-04 NOTE — Progress Notes (Signed)
The patient is admitted to 2A 93 with the diagnosis of acute on chronic CHF. A & O x 4. Denied any acute pain at this time. No acute respiratory distress noted. Patient oriented to ascom/call bell and staff. Full assessment to epic completed. Will continue to monitor

## 2019-08-04 NOTE — ED Notes (Signed)
.. ED TO INPATIENT HANDOFF REPORT  ED Nurse Name and Phone #: Willene Hatchet Name/Age/Gender Christine Oneill 58 y.o. female Room/Bed: ED03A/ED03A  Code Status   Code Status: Full Code  Home/SNF/Other Home Patient oriented to: self, place, time and situation Is this baseline? Yes   Triage Complete: Triage complete  Chief Complaint Gaining Weight; Hurts to Breath  Triage Note Patient reports CHF diagnosis in January.  Has noticed weight gain and hurts to breath and stomach feels tight.  Patient normally on O2 at all times but tank ran out on way to the ED.   Allergies Allergies  Allergen Reactions  . Prednisone     States had breathing difficulty on this with flushing and faint feeling.    Level of Care/Admitting Diagnosis ED Disposition    ED Disposition Condition [facility] Area: [place] [[id number]]  Level of Care: Telemetry [5]  Covid Evaluation: Confirmed COVID Negative  Diagnosis: Acute on chronic diastolic CHF (congestive heart failure) [facility]) [[id number]]  Admitting Physician: Christel Mormon [[id number]]  Attending Physician: Christel Mormon [[id number]]  Estimated length of stay: past midnight tomorrow  Certification:: I certify this patient will need inpatient services for at least 2 midnights  PT Class (Do Not Modify): Inpatient [101]  PT Acc Code (Do Not Modify): Private [1]       B Medical/Surgery History Past Medical History:  Diagnosis Date  . Abnormal uterine bleeding (AUB)   . Asthma   . CHF (congestive heart failure) ([place])   . COPD (chronic obstructive pulmonary disease) ([place])   . Hypertension   . Obstructive sleep apnea    with CPAP  . Pulmonary HTN ([place])    Past Surgical History:  Procedure Laterality Date  . APPENDECTOMY    . CESAREAN SECTION    . COLPOSCOPY N/A 04/30/2018   Procedure: COLPOSCOPY;  Surgeon: Rubie Maid, MD;  Location: [facility] ORS;  Service: Gynecology;  Laterality: N/A;  . HYSTEROSCOPY W/D&C N/A  04/30/2018   Procedure: DILATATION AND CURETTAGE /HYSTEROSCOPY;  Surgeon: Rubie Maid, MD;  Location: [facility] ORS;  Service: Gynecology;  Laterality: N/A;     A IV Location/Drains/Wounds Patient Lines/Drains/Airways Status   Active Line/Drains/Airways    Name:   Placement date:   Placement time:   Site:   Days:   Peripheral IV 08/03/19 Right Antecubital   08/03/19    2303    Antecubital   1   Incision (Closed) 04/30/18 Vagina   04/30/18    1429     461          Intake/Output Last 24 hours No intake or output data in the 24 hours ending 08/04/19 0129  Labs/Imaging Results for orders placed or performed during the hospital encounter of 08/03/19 (from the past 48 hour(s))  Brain natriuretic peptide     Status: None   Collection Time: 08/03/19 11:02 PM  Result Value Ref Range   B Natriuretic Peptide 36.0 0.0 - 100.0 pg/mL    Comment: Performed at [facility], [address] [place], [place] [address]  Comprehensive metabolic panel     Status: Abnormal   Collection Time: 08/03/19 11:02 PM  Result Value Ref Range   Sodium 139 135 - 145 mmol/L   Potassium 3.7 3.5 - 5.1 mmol/L   Chloride 104 98 - 111 mmol/L   CO2 27 22 - 32 mmol/L   Glucose, Bld 132 (H) 70 - 99 mg/dL   BUN 17 6 -  20 mg/dL   Creatinine, Ser 0.79 0.44 - 1.00 mg/dL   Calcium 9.2 8.9 - 10.3 mg/dL   Total Protein 7.4 6.5 - 8.1 g/dL   Albumin 3.2 (L) 3.5 - 5.0 g/dL   AST 116 (H) 15 - 41 U/L   ALT 155 (H) 0 - 44 U/L   Alkaline Phosphatase 126 38 - 126 U/L   Total Bilirubin 0.3 0.3 - 1.2 mg/dL   GFR calc non Af Amer >60 >60 mL/min   GFR calc Af Amer >60 >60 mL/min   Anion gap 8 5 - 15    Comment: Performed at [facility], [address] [place], [place] [address]  CBC with Differential/Platelet     Status: Abnormal   Collection Time: 08/03/19 11:02 PM  Result Value Ref Range   WBC 7.6 4.0 - 10.5 K/uL   RBC 4.66 3.87 - 5.11 MIL/uL   Hemoglobin 13.3 12.0 - 15.0 g/dL   HCT 41.9 36.0 - 46.0 %    MCV 89.9 80.0 - 100.0 fL   MCH 28.5 26.0 - 34.0 pg   MCHC 31.7 30.0 - 36.0 g/dL   RDW 15.7 (H) 11.5 - 15.5 %   Platelets 201 150 - 400 K/uL   nRBC 0.3 (H) 0.0 - 0.2 %   Neutrophils Relative % 68 %   Neutro Abs 5.2 1.7 - 7.7 K/uL   Lymphocytes Relative 18 %   Lymphs Abs 1.4 0.7 - 4.0 K/uL   Monocytes Relative 7 %   Monocytes Absolute 0.5 0.1 - 1.0 K/uL   Eosinophils Relative 2 %   Eosinophils Absolute 0.2 0.0 - 0.5 K/uL   Basophils Relative 1 %   Basophils Absolute 0.1 0.0 - 0.1 K/uL   Immature Granulocytes 4 %   Abs Immature Granulocytes 0.31 (H) 0.00 - 0.07 K/uL    Comment: Performed at [facility], [place], [place] [address]  Troponin I (High Sensitivity)     Status: None   Collection Time: 08/03/19 11:02 PM  Result Value Ref Range   Troponin I (High Sensitivity) 8 <18 ng/L    Comment: (NOTE) Elevated high sensitivity troponin I (hsTnI) values and significant  changes across serial measurements may suggest ACS but many other  chronic and acute conditions are known to elevate hsTnI results.  Refer to the "Links" section for chest pain algorithms and additional  guidance. Performed at [facility], [address] [place], [place] [address]   SARS Coronavirus 2 [facility] order, Performed in [facility] hospital lab) Nasopharyngeal Nasopharyngeal Swab     Status: None   Collection Time: 08/03/19 11:29 PM   Specimen: Nasopharyngeal Swab  Result Value Ref Range   SARS Coronavirus 2 NEGATIVE NEGATIVE    Comment: (NOTE) If result is NEGATIVE SARS-CoV-2 target nucleic acids are NOT DETECTED. The SARS-CoV-2 RNA is generally detectable in upper and lower  respiratory specimens during the acute phase of infection. The lowest  concentration of SARS-CoV-2 viral copies this assay can detect is 250  copies / mL. A negative result does not preclude SARS-CoV-2 infection  and should not be used as the sole basis for treatment or other  patient  management decisions.  A negative result may occur with  improper specimen collection / handling, submission of specimen other  than nasopharyngeal swab, presence of viral mutation(s) within the  areas targeted by this assay, and inadequate number of viral copies  (<250 copies / mL). A negative result must be combined  with clinical  observations, patient history, and epidemiological information. If result is POSITIVE SARS-CoV-2 target nucleic acids are DETECTED. The SARS-CoV-2 RNA is generally detectable in upper and lower  respiratory specimens dur ing the acute phase of infection.  Positive  results are indicative of active infection with SARS-CoV-2.  Clinical  correlation with patient history and other diagnostic information is  necessary to determine patient infection status.  Positive results do  not rule out bacterial infection or co-infection with other viruses. If result is PRESUMPTIVE POSTIVE SARS-CoV-2 nucleic acids MAY BE PRESENT.   A presumptive positive result was obtained on the submitted specimen  and confirmed on repeat testing.  While 2019 novel coronavirus  (SARS-CoV-2) nucleic acids may be present in the submitted sample  additional confirmatory testing may be necessary for epidemiological  and / or clinical management purposes  to differentiate between  SARS-CoV-2 and other Sarbecovirus currently known to infect humans.  If clinically indicated additional testing with an alternate test  methodology [phone]) is advised. The SARS-CoV-2 RNA is generally  detectable in upper and lower respiratory sp ecimens during the acute  phase of infection. The expected result is Negative. Fact Sheet for Patients:  StrictlyIdeas.no Fact Sheet for Healthcare Providers: BankingDealers.co.za This test is not yet approved or cleared by the [place] FDA and has been authorized for detection and/or diagnosis of SARS-CoV-2 by FDA under  an Emergency Use Authorization (EUA).  This EUA will remain in effect (meaning this test can be used) for the duration of the COVID-19 declaration under Section 564(b)(1) of the Act, 21 U.S.C. section 360bbb-3(b)(1), unless the authorization is terminated or revoked sooner. Performed at [facility], [address], [place], [place] [address]    Dg Chest Portable 1 View  Result Date: 08/03/2019 CLINICAL DATA:  Shortness of breath. History of CHF. EXAM: PORTABLE CHEST 1 VIEW COMPARISON:  Most recent radiograph 06/18/2019 FINDINGS: Mild cardiomegaly, unchanged. Central vascular congestion. Mild interstitial thickening suspicious for pulmonary edema. Mild scarring at the left lung base. No confluent airspace disease, large pleural effusion or pneumothorax. No acute osseous abnormalities. IMPRESSION: Cardiomegaly with vascular congestion and mild interstitial thickening, suspicious for pulmonary edema. Electronically Signed   By: Keith Rake M.D.   On: 08/03/2019 23:40    Pending Labs Unresulted Labs (From admission, onward)    Start     Ordered   08/05/19 7124  Basic metabolic panel  Daily,   STAT     08/04/19 0114   08/04/19 0500  CBC WITH DIFFERENTIAL  Tomorrow morning,   STAT     08/04/19 0114   08/04/19 0107  Magnesium  Once,   STAT     08/04/19 0114   08/04/19 0101  HIV antibody (Routine Testing)  Once,   STAT     08/04/19 0114   08/03/19 2323  Urinalysis, Routine w reflex microscopic  ONCE - STAT,   STAT     08/03/19 2322          Vitals/Pain Today's Vitals   08/03/19 2300 08/04/19 0030 08/04/19 0045 08/04/19 0100  BP: 138/89 132/89  (!) 144/87  Pulse: 89 81    Resp: 20 (!) 31 16 (!) 29  Temp: 100.3 F (37.9 C)     TempSrc: Oral     SpO2: 96% 100%    Weight: 129.3 kg     Height: 5\' 3"  (1.6 m)     PainSc: 8        Isolation Precautions No active isolations  Medications Medications  metolazone (ZAROXOLYN) tablet 2.5 mg (has no administration in time  range)  metoprolol succinate (TOPROL-XL) 24 hr tablet 12.5 mg (has no administration in time range)  albuterol (PROVENTIL) (2.5 MG/3ML) 0.083% nebulizer solution 2.5 mg (has no administration in time range)  sodium chloride flush (NS) 0.9 % injection 3 mL (has no administration in time range)  sodium chloride flush (NS) 0.9 % injection 3 mL (has no administration in time range)  0.9 %  sodium chloride infusion (has no administration in time range)  acetaminophen (TYLENOL) tablet 650 mg (has no administration in time range)  ondansetron (ZOFRAN) injection 4 mg (has no administration in time range)  furosemide (LASIX) injection 40 mg (has no administration in time range)  aspirin EC tablet 81 mg (has no administration in time range)  zolpidem (AMBIEN) tablet 5 mg (has no administration in time range)  enoxaparin (LOVENOX) injection 40 mg (has no administration in time range)  furosemide (LASIX) injection 40 mg (40 mg Intravenous Given 08/04/19 0100)    Mobility walks Low fall risk   Focused Assessments Cardiac Assessment Handoff:    Lab Results  Component Value Date   TROPONINI <0.03 04/08/2018   No results found for: DDIMER Does the Patient currently have chest pain? No     R Recommendations: See Admitting Provider Note  Report given to:   Additional Notes:

## 2019-08-04 NOTE — ED Notes (Signed)
Pt given warm blanket and ice chips. MD notified. Head of bed lowered to pt comfort by this tech.

## 2019-08-04 NOTE — Progress Notes (Signed)
Advance care planning  Purpose of Encounter CHF  Parties in Attendance Patient  Patients Decisional capacity Patient is alert and oriented.  Able to make medical decisions.  Her healthcare power of attorney is daughter Christine Oneill.  No ACP documents in place.  Discussed in detail regarding CHF.  Treatment plan , prognosis discussed.  All questions answered  CODE STATUS discussed and patient requests aggressive medical care with CPR/intubation/defibrillation if needed.  Orders entered and CODE STATUS changed  FULL CODE  Time spent - 17 minutes

## 2019-08-05 ENCOUNTER — Inpatient Hospital Stay
Admit: 2019-08-05 | Discharge: 2019-08-05 | Disposition: A | Discharge status: Home / Self Care | Payer: BLUE CROSS/BLUE SHIELD | Attending: Family Medicine | Admitting: Family Medicine

## 2019-08-05 LAB — BASIC METABOLIC PANEL
Anion gap: 17 — ABNORMAL HIGH (ref 5–15)
Anion gap: 10 (ref 5–15)
BUN: 22 mg/dL — ABNORMAL HIGH (ref 6–20)
BUN: 27 mg/dL — ABNORMAL HIGH (ref 6–20)
CO2: 28 mmol/L (ref 22–32)
CO2: 26 mmol/L (ref 22–32)
Calcium: 9 mg/dL (ref 8.9–10.3)
Calcium: 9.7 mg/dL (ref 8.9–10.3)
Chloride: 91 mmol/L — ABNORMAL LOW (ref 98–111)
Chloride: 98 mmol/L (ref 98–111)
Creatinine, Ser: 0.72 mg/dL (ref 0.44–1.00)
Creatinine, Ser: 1.23 mg/dL — ABNORMAL HIGH (ref 0.44–1.00)
GFR calc Af Amer: >60 mL/min (ref >60)
GFR calc Af Amer: 57 mL/min — ABNORMAL LOW (ref >60)
GFR calc non Af Amer: >60 mL/min (ref >60)
GFR calc non Af Amer: 49 mL/min — ABNORMAL LOW (ref >60)
Glucose, Bld: 108 mg/dL — ABNORMAL HIGH (ref 70–99)
Glucose, Bld: 128 mg/dL — ABNORMAL HIGH (ref 70–99)
Potassium: 3.4 mmol/L — ABNORMAL LOW (ref 3.5–5.1)
Potassium: 4 mmol/L (ref 3.5–5.1)
Sodium: 136 mmol/L (ref 135–145)
Sodium: 134 mmol/L — ABNORMAL LOW (ref 135–145)

## 2019-08-05 LAB — MAGNESIUM: Magnesium: 1.8 mg/dL (ref 1.7–2.4)

## 2019-08-05 MED ORDER — POTASSIUM CHLORIDE CRYS ER 20 MEQ PO TBCR
40.0000 meq | EXTENDED_RELEASE_TABLET | ORAL | Status: AC
  Administered 2019-08-05 (×2): 40 meq via ORAL
  Filled 2019-08-05 (×2): qty 2

## 2019-08-05 MED ORDER — METOLAZONE 5 MG PO TABS
5.0000 mg | ORAL_TABLET | ORAL | Status: DC
  Administered 2019-08-06 – 2019-08-08 (×2): 5 mg via ORAL
  Filled 2019-08-05 (×2): qty 1

## 2019-08-05 MED ORDER — METOLAZONE 5 MG PO TABS
10.0000 mg | ORAL_TABLET | Freq: Once | ORAL | Status: AC
  Administered 2019-08-05: 10 mg via ORAL
  Filled 2019-08-05: qty 2

## 2019-08-05 MED ORDER — DOCUSATE SODIUM 100 MG PO CAPS
100.0000 mg | ORAL_CAPSULE | Freq: Two times a day (BID) | ORAL | Status: DC
  Administered 2019-08-05 – 2019-08-10 (×11): 100 mg via ORAL
  Filled 2019-08-05 (×11): qty 1

## 2019-08-05 MED ORDER — CYCLOBENZAPRINE HCL 10 MG PO TABS
10.0000 mg | ORAL_TABLET | Freq: Once | ORAL | Status: AC
  Administered 2019-08-05: 10 mg via ORAL
  Filled 2019-08-05: qty 1

## 2019-08-05 NOTE — Progress Notes (Signed)
*  PRELIMINARY RESULTS* Echocardiogram 2D Echocardiogram has been performed.  Sherrie Sport 08/05/2019, 8:09 AM

## 2019-08-05 NOTE — Progress Notes (Signed)
[facility] [place] NAME: Christine Oneill    MR#:  [id number]  DATE OF BIRTH:  1961/10/06  SUBJECTIVE:  CHIEF COMPLAINT:   Chief Complaint  Patient presents with  . Shortness of Breath   Shortness of breath is present with some improvement  REVIEW OF SYSTEMS:    Review of Systems  Constitutional: Positive for malaise/fatigue. Negative for chills and fever.  HENT: Negative for sore throat.   Eyes: Negative for blurred vision, double vision and pain.  Respiratory: Positive for shortness of breath. Negative for cough, hemoptysis and wheezing.   Cardiovascular: Positive for orthopnea and leg swelling. Negative for chest pain and palpitations.  Gastrointestinal: Negative for abdominal pain, constipation, diarrhea, heartburn, nausea and vomiting.  Genitourinary: Negative for dysuria and hematuria.  Musculoskeletal: Negative for back pain and joint pain.  Skin: Negative for rash.  Neurological: Negative for sensory change, speech change, focal weakness and headaches.  Endo/Heme/Allergies: Does not bruise/bleed easily.  Psychiatric/Behavioral: Negative for depression. The patient is not nervous/anxious.     DRUG ALLERGIES:   Allergies  Allergen Reactions  . Prednisone     States had breathing difficulty on this with flushing and faint feeling.    VITALS:  Blood pressure (!) 133/96, pulse 79, temperature 98.1 F (36.7 C), temperature source Oral, resp. rate 19, height 5\' 3"  (1.6 m), weight (!) 142.7 kg, SpO2 97 %.  PHYSICAL EXAMINATION:   Physical Exam  GENERAL:  58 y.o.-year-old patient lying in the bed with no acute distress.  Obese EYES: Pupils equal, round, reactive to light and accommodation. No scleral icterus. Extraocular muscles intact.  HEENT: Head atraumatic, normocephalic. Oropharynx and nasopharynx clear.  NECK:  Supple, no jugular venous distention. No thyroid enlargement, no tenderness.  LUNGS: Bibasilar  crackles CARDIOVASCULAR: S1, S2 normal. No murmurs, rubs, or gallops.  ABDOMEN: Soft, nontender, nondistended. Bowel sounds present. No organomegaly or mass.  EXTREMITIES: No cyanosis, clubbing.  Lower extremity edema NEUROLOGIC: Cranial nerves II through XII are intact. No focal Motor or sensory deficits b/l.   PSYCHIATRIC: The patient is alert and oriented x 3.  SKIN: No obvious rash, lesion, or ulcer.   LABORATORY PANEL:   CBC Recent Labs  Lab 08/04/19 0303  WBC 8.2  HGB 13.7  HCT 43.8  PLT 203   ------------------------------------------------------------------------------------------------------------------ Chemistries  Recent Labs  Lab 08/03/19 2302 08/04/19 0303 08/05/19 0426  NA 139  --  136  K 3.7  --  3.4*  CL 104  --  91*  CO2 27  --  28  GLUCOSE 132*  --  108*  BUN 17  --  22*  CREATININE 0.79  --  0.72  CALCIUM 9.2  --  9.0  MG  --  1.8  --   AST 116*  --   --   ALT 155*  --   --   ALKPHOS 126  --   --   BILITOT 0.3  --   --    ------------------------------------------------------------------------------------------------------------------  Cardiac Enzymes No results for input(s): TROPONINI in the last 168 hours. ------------------------------------------------------------------------------------------------------------------  RADIOLOGY:  Dg Chest Portable 1 View  Result Date: 08/03/2019 CLINICAL DATA:  Shortness of breath. History of CHF. EXAM: PORTABLE CHEST 1 VIEW COMPARISON:  Most recent radiograph 06/18/2019 FINDINGS: Mild cardiomegaly, unchanged. Central vascular congestion. Mild interstitial thickening suspicious for pulmonary edema. Mild scarring at the left lung base. No confluent airspace disease, large pleural effusion or pneumothorax. No acute osseous abnormalities. IMPRESSION: Cardiomegaly with  vascular congestion and mild interstitial thickening, suspicious for pulmonary edema. Electronically Signed   By: Keith Rake M.D.   On:  08/03/2019 23:40     ASSESSMENT AND PLAN:   1.  Acute on chronic diastolic CHF with chronic respiratory failure - IV Lasix, Beta blockers - Input and Output - Counseled to limit fluids and Salt - Monitor Bun/Cr and Potassium - Echo -Cardiology follow up after discharge Will need further inpatient diuresis at least for another day or 2 in the hospital.  2.  Hypertension.  Toprol-XL will be continued.  3.  Left lower extremity radiculopathy with sciatica.  Neurontin PRN  4.  Obesity hypoventilation syndrome/obstructive sleep apnea.  We will continue CPAP.  5.  History of tobacco abuse.  She quit smoking in January of 2019.  6.  DVT prophylaxis.  Subcutaneous Lovenox.  All the records are reviewed and case discussed with Care Management/Social Worker Management plans discussed with the patient, family and they are in agreement.  CODE STATUS: FULL CODE  TOTAL TIME TAKING CARE OF THIS PATIENT: 35 minutes.   POSSIBLE D/C IN 1-2 DAYS, DEPENDING ON CLINICAL CONDITION.  Christine Oneill M.D on 08/05/2019 at 1:00 PM  Between 7am to 6pm - Pager - [phone]  After 6pm go to [email or web address] - password [email or web address] [place] [facility]  Office  [phone]  CC: Primary care physician; Center, [place]  Note: This dictation was prepared with Diplomatic Services operational officer dictation along with smaller phrase technology. Any transcriptional errors that result from this process are unintentional.

## 2019-08-06 LAB — BASIC METABOLIC PANEL
Anion gap: 11 (ref 5–15)
BUN: 27 mg/dL — ABNORMAL HIGH (ref 6–20)
CO2: 28 mmol/L (ref 22–32)
Calcium: 9.9 mg/dL (ref 8.9–10.3)
Chloride: 96 mmol/L — ABNORMAL LOW (ref 98–111)
Creatinine, Ser: 0.97 mg/dL (ref 0.44–1.00)
GFR calc Af Amer: >60 mL/min (ref >60)
GFR calc non Af Amer: >60 mL/min (ref >60)
Glucose, Bld: 118 mg/dL — ABNORMAL HIGH (ref 70–99)
Potassium: 4.1 mmol/L (ref 3.5–5.1)
Sodium: 135 mmol/L (ref 135–145)

## 2019-08-06 LAB — ECHOCARDIOGRAM COMPLETE
Height: 63 in
Weight: 5032.48 oz

## 2019-08-06 LAB — HIV ANTIBODY (ROUTINE TESTING W REFLEX): HIV Screen 4th Generation wRfx: NONREACTIVE

## 2019-08-06 MED ORDER — GABAPENTIN 100 MG PO CAPS
100.0000 mg | ORAL_CAPSULE | Freq: Three times a day (TID) | ORAL | Status: DC
  Administered 2019-08-06 – 2019-08-11 (×15): 100 mg via ORAL
  Filled 2019-08-06 (×15): qty 1

## 2019-08-06 MED ORDER — ENSURE MAX PROTEIN PO LIQD
11.0000 [oz_av] | Freq: Every day | ORAL | Status: DC
  Administered 2019-08-06: 11 [oz_av] via ORAL
  Filled 2019-08-06: qty 330

## 2019-08-06 MED ORDER — HYDROCODONE-ACETAMINOPHEN 5-325 MG PO TABS
1.0000 | ORAL_TABLET | Freq: Four times a day (QID) | ORAL | Status: DC | PRN
  Administered 2019-08-06 – 2019-08-11 (×14): 1 via ORAL
  Filled 2019-08-06 (×14): qty 1

## 2019-08-06 NOTE — Progress Notes (Signed)
Spoke with patient regarding the HF Clinic. Patient has been seen by [place] in the past and an appointment was scheduled for 08/12/2019.   She was concerned about some discoloration in her lower legs and she was instructed to speak the doctor about that.

## 2019-08-06 NOTE — Progress Notes (Addendum)
Cardiovascular and Pulmonary Nurse Navigator Note:    58 year old African American female with know hx of multiple medical problems including OSA on home CPAP, HTN, Diastolic CHF, obesity, left sciatica who presented to the ER with acute onset of 10 pound weight gain within 24 horu period as well as increasing SOB for the last few days prior to admission.  8 / 15/2020 - BNP 36  /  with BMI of 54.88.     CXR  IMPRESSION: Cardiomegaly with vascular congestion and mild interstitial thickening, suspicious for pulmonary edema.   ECHOCARDIOGRAM COMPLETE Patient name: Christine Oneill  MRN: [id number]  Age: 58 y.o.  Sex: female   Result status: Final result     ECHOCARDIOGRAM REPORT       Patient Name:   Christine Oneill Date of Exam: 08/05/2019 Medical Rec #:  [id number]   Height:       63.0 in Accession #:    [id number]  Weight:       314.5 lb Date of Birth:  [birth year]    BSA:          2.34 m Patient Age:    9 years    BP:           137/96 mmHg Patient Gender: F           HR:           79 bpm. Exam Location:  [facility]    Procedure: 2D Echo, Cardiac Doppler and Color Doppler  Indications:     CHF- acute diastolic 063.01   History:         Patient has prior history of Echocardiogram examinations, most                  recent 12/26/2017. COPD Risk Factors: Hypertension and Sleep                  Apnea. PHTN.   Sonographer:     Sherrie Sport RDCS (AE) Referring Phys:  [id number] Arvella Merles MANSY Diagnosing Phys: Yolonda Kida MD    Sonographer Comments: Technically difficult study due to poor echo windows and no apical window. IMPRESSIONS    1. The left ventricle has normal systolic function with an ejection fraction of 60-65%. The cavity size was mildly dilated. Left ventricular diastolic parameters were normal. No evidence of left ventricular regional wall motion abnormalities.  2. The right ventricle has normal systolic function. The cavity was normal. There is no increase in right  ventricular wall thickness.  3. The aortic valve is grossly normal.  4. The aorta is normal unless otherwise noted.  5. The interatrial septum was not well visualized.    CHF Education:?? Educational session with patient completed. Reviewed "Living Better with Heart Failure" packet. Briefly reviewed definition of heart failure and signs and symptoms of an exacerbation.?Explained to patient that HF is a chronic illness which requires self-assessment / self-management along with help from the cardiologist/PCP.?Discussed EF value, his EF and normal EF.  Patient's EF 60 - 65%.    *Reviewed importance of and reason behind checking weight daily in the AM, after using the bathroom, but before getting dressed. Patient has functioning scale.  Patient reported she weighed one night at 305 and the next morning she was 315 pounds.  Instructed patient to weigh each morning after urinating and before eating or drinking anything.    *Reviewed with patient the following information: *Discussed when to call the Dr= weight gain of >2-3lb  overnight of 5lb in a week, *Discussed yellow zone= call MD: weight gain of >2-3lb overnight of 5lb in a week, increased swelling, increased SOB when lying down, chest discomfort, dizziness, increased fatigue *Red Zone= call 911: struggle to breath, fainting or near fainting, significant chest pain  *Heart Failure Zone magnet given and reviewed with patient.  *Diet - Reviewed low sodium diet. Patient is currently on Diet Carb Modified.  Dietitian Consultation for diet education has been ordered and completed today.  This RN noted a bag of salty snacks at the bedside.  Patient stated the bag of salty snacks belonged to her sister.  Also, noted a biscuit wrapped up and lying in the window sill. Encouraged patient to avoid fast foods / processed meats and to read food labels to check the sodium content of foods.  Reminded patient no more than 2000 mg of sodium per day.   *Discussed  fluid intake with patient as well. Patient not currently on a fluid restriction, but advised no more than 64 ounces of fluid per day.?Multiple drinks / cups / water pitcher were at the patient's bedside.  Patient has not been restricting her fluids. This RN stressed the importance of following recommended fluid restriction of no more than 64 ounces per day unless otherwise specified / restricted by MD.   ? *Instructed patient to take medications as prescribed for heart failure. Explained briefly why pt is on the medications (either make you feel better, live longer or keep you out of the hospital) and discussed monitoring and side effects.  *Discussed exercise / activity.  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  Patient works from 6 a.m. to 5 p.m. every day.  Patient stated she keeps her oxygen at work if she needs it, but only wears it prn.  Patient is currently requiring continuous oxygen via [place].  Informed patient given the dx of Chronic Diastolic CHF with EF of 60 - 65%, she qualifies to participate in Pulmonary Rehab.  Patient declined to participate in Pulmonary Rehab at this time due to her work schedule.    *Smoking Cessation- Patient is a FORMER smoker.    *[facility] - Patient reports she has been seen in the HF Clinic before.  Patient has a follow-up appointment in the [facility] on August 12, 2019 at 10:30 a.m.    Again, the 5 Steps to Living Better with Heart Failure were reviewed with patient.  Patient thanked me for providing the above information. ?  Roanna Epley, RN, BSN, [place]  [facility] Cardiac & Pulmonary Rehab  Cardiovascular & Pulmonary Nurse Navigator  Direct Line: [phone]  Department Phone #: [phone] Fax: [phone]  Email Address: [email or web address]   ?

## 2019-08-06 NOTE — Progress Notes (Signed)
[place] [facility] [place] NAME: Christine Oneill    MR#:  [id number]  DATE OF BIRTH:  05/11/1961  SUBJECTIVE: Patient still feeling short of breath, no chest pain but feeling short of breath with minimal exertion.  Also concerned about discoloration in the legs, daughter mentioned that she uses CPAP at night.  CHIEF COMPLAINT:   Chief Complaint  Patient presents with  . Shortness of Breath   No chest pain, complains of leg cramps.  REVIEW OF SYSTEMS:    Review of Systems  Constitutional: Positive for malaise/fatigue. Negative for chills and fever.  HENT: Negative for sore throat.   Eyes: Negative for blurred vision, double vision and pain.  Respiratory: Positive for shortness of breath. Negative for cough, hemoptysis and wheezing.   Cardiovascular: Positive for orthopnea and leg swelling. Negative for chest pain and palpitations.  Gastrointestinal: Negative for abdominal pain, constipation, diarrhea, heartburn, nausea and vomiting.  Genitourinary: Negative for dysuria and hematuria.  Musculoskeletal: Negative for back pain and joint pain.  Skin: Negative for rash.  Neurological: Negative for sensory change, speech change, focal weakness and headaches.  Endo/Heme/Allergies: Does not bruise/bleed easily.  Psychiatric/Behavioral: Negative for depression. The patient is not nervous/anxious.     DRUG ALLERGIES:   Allergies  Allergen Reactions  . Prednisone     States had breathing difficulty on this with flushing and faint feeling.    VITALS:  Blood pressure 114/80, pulse 85, temperature 97.6 F (36.4 C), temperature source Oral, resp. rate 18, height 5\' 3"  (1.6 m), weight (!) 140.5 kg, SpO2 100 %.  PHYSICAL EXAMINATION:   Physical Exam  GENERAL:  58 y.o.-year-old patient lying in the bed with no acute distress.  Obese EYES: Pupils equal, round, reactive to light . No scleral icterus. Extraocular muscles intact.  HEENT: Head atraumatic,  normocephalic. Oropharynx and nasopharynx clear.  NECK:  Supple, no jugular venous distention. No thyroid enlargement, no tenderness.  LUNGS: Bibasilar crackles CARDIOVASCULAR: S1, S2 normal. No murmurs, rubs, or gallops.  ABDOMEN: Soft, nontender, nondistended. Bowel sounds present. No organomegaly or mass.  EXTREMITIES: No cyanosis, clubbing.  Lower extremity edema, patient has more discoloration of skin on the lower part of the legs bilaterally. NEUROLOGIC: Cranial nerves II through XII are intact. No focal Motor or sensory deficits b/l.   PSYCHIATRIC: The patient is alert and oriented x 3.  SKIN: No obvious rash, lesion, or ulcer.   LABORATORY PANEL:   CBC Recent Labs  Lab 08/04/19 0303  WBC 8.2  HGB 13.7  HCT 43.8  PLT 203   ------------------------------------------------------------------------------------------------------------------ Chemistries  Recent Labs  Lab 08/03/19 2302  08/05/19 2255 08/06/19 0409  NA 139   < > 134* 135  K 3.7   < > 4.0 4.1  CL 104   < > 98 96*  CO2 27   < > 26 28  GLUCOSE 132*   < > 128* 118*  BUN 17   < > 27* 27*  CREATININE 0.79   < > 1.23* 0.97  CALCIUM 9.2   < > 9.7 9.9  MG  --    < > 1.8  --   AST 116*  --   --   --   ALT 155*  --   --   --   ALKPHOS 126  --   --   --   BILITOT 0.3  --   --   --    < > = values in this interval  not displayed.   ------------------------------------------------------------------------------------------------------------------  Cardiac Enzymes No results for input(s): TROPONINI in the last 168 hours. ------------------------------------------------------------------------------------------------------------------  RADIOLOGY:  No results found.   ASSESSMENT AND PLAN:   1.  Acute on chronic diastolic CHF with chronic respiratory failure, clinically not improved yet, continue IV Lasix at the present dose. - IV Lasix, Beta blockers - Input and Output - Counseled to limit fluids and  Salt -Renal function is stable,-echocardiogram showed EF more than 60 to 65%.  No evidence of regional wall motion abnormality.   Cardiology follow up after discharge   Will need further inpatient diuresis at least for another day or 2 in the hospital.  2.  Hypertension.  Toprol-XL will be continued.  3.  Left lower extremity radiculopathy with sciatica.    Neurontin 100 mg 3 times daily as scheduled instead of as needed, discussed with patient, patient's daughter.  4.  Obesity hypoventilation syndrome/obstructive sleep apnea.  We will continue CPAP.,  I reordered the CPAP.  5.  History of tobacco abuse.  She quit smoking in January of 2019.  6.  DVT prophylaxis.  Subcutaneous Lovenox. #7 leg cramps likely due to diuretics, hypomagnesemia, replace magnesium, continue Neurontin. 8.  Skin discoloration in the legs likely due to CHF, patient can see dermatology as an outpatient if continues to bother her. All the records are reviewed and case discussed with Care Management/Social Worker Management plans discussed with the patient, family and they are in agreement.  CODE STATUS: FULL CODE  TOTAL TIME TAKING CARE OF THIS PATIENT: 35 minutes.  More than 50% time spent in counseling, coordination of care. POSSIBLE D/C IN 1-2 DAYS, DEPENDING ON CLINICAL CONDITION.  Epifanio Lesches M.D on 08/06/2019 at 2:59 PM  Between 7am to 6pm - Pager - [phone]  After 6pm go to [email or web address] - password [email or web address] [facility]  Office  [phone]  CC: Primary care physician; Center, [place]  Note: This dictation was prepared with Diplomatic Services operational officer dictation along with smaller phrase technology. Any transcriptional errors that result from this process are unintentional.

## 2019-08-06 NOTE — Plan of Care (Signed)
Nutrition Education Note  RD consulted for nutrition education regarding CHF.  58 y.o. African-American female with a known history of multiple medical problems that include obstructive sleep apnea on home CPAP, hypertension, diastolic CHF, obesity admitted with CHF.  RD provided "Low Sodium Nutrition Therapy" handout from the Academy of [employer] and Dietetics. Reviewed patient's dietary recall. Provided examples on ways to decrease sodium intake in diet. Discouraged intake of processed foods and use of salt shaker. Encouraged fresh fruits and vegetables as well as whole grain sources of carbohydrates to maximize fiber intake.   RD discussed why it is important for patient to adhere to diet recommendations, and emphasized the role of fluids, foods to avoid, and importance of weighing self daily. Teach back method used.  Expect good compliance.  Body mass index is 54.88 kg/m. Pt meets criteria for morbid obesity based on current BMI.  Current diet order is 2 gram sodium diet, patient is consuming approximately 100% of meals at this time. Labs and medications reviewed. No further nutrition interventions warranted at this time. RD contact information provided. If additional nutrition issues arise, please re-consult RD.   Koleen Distance MS, RD, LDN Pager #- [phone] Office#- [phone] After Hours Pager: [phone]

## 2019-08-07 LAB — BASIC METABOLIC PANEL
Anion gap: 13 (ref 5–15)
BUN: 39 mg/dL — ABNORMAL HIGH (ref 6–20)
CO2: 28 mmol/L (ref 22–32)
Calcium: 9.9 mg/dL (ref 8.9–10.3)
Chloride: 94 mmol/L — ABNORMAL LOW (ref 98–111)
Creatinine, Ser: 0.99 mg/dL (ref 0.44–1.00)
GFR calc Af Amer: >60 mL/min (ref >60)
GFR calc non Af Amer: >60 mL/min (ref >60)
Glucose, Bld: 110 mg/dL — ABNORMAL HIGH (ref 70–99)
Potassium: 3.5 mmol/L (ref 3.5–5.1)
Sodium: 135 mmol/L (ref 135–145)

## 2019-08-07 LAB — MAGNESIUM: Magnesium: 1.9 mg/dL (ref 1.7–2.4)

## 2019-08-07 MED ORDER — POTASSIUM CHLORIDE CRYS ER 20 MEQ PO TBCR
40.0000 meq | EXTENDED_RELEASE_TABLET | ORAL | Status: AC
  Administered 2019-08-07 (×2): 40 meq via ORAL
  Filled 2019-08-07 (×2): qty 2

## 2019-08-07 MED ORDER — MAGNESIUM SULFATE 2 GM/50ML IV SOLN
2.0000 g | Freq: Once | INTRAVENOUS | Status: AC
  Administered 2019-08-07: 2 g via INTRAVENOUS
  Filled 2019-08-07: qty 50

## 2019-08-07 MED ORDER — BISACODYL 5 MG PO TBEC
10.0000 mg | DELAYED_RELEASE_TABLET | Freq: Every day | ORAL | Status: DC | PRN
  Administered 2019-08-07 – 2019-08-10 (×4): 10 mg via ORAL
  Filled 2019-08-07 (×4): qty 2

## 2019-08-07 NOTE — Progress Notes (Addendum)
[place] at [place] NAME: Christine Oneill    MR#:  [id number]  DATE OF BIRTH:  22-May-1961  SUBJECTIVE:  feeling slightly better than yesterday.  But has constipation and requesting stool softeners.  Leg cramps are still bothering her.  CHIEF COMPLAINT:   Chief Complaint  Patient presents with  . Shortness of Breath   No chest pain, complains of leg cramps.  REVIEW OF SYSTEMS:    Review of Systems  Constitutional: Negative for chills, fever and malaise/fatigue.  HENT: Negative for sore throat.   Eyes: Negative for blurred vision, double vision and pain.  Respiratory: Positive for shortness of breath. Negative for cough, hemoptysis and wheezing.   Cardiovascular: Positive for orthopnea and leg swelling. Negative for chest pain and palpitations.  Gastrointestinal: Negative for abdominal pain, constipation, diarrhea, heartburn, nausea and vomiting.  Genitourinary: Negative for dysuria and hematuria.  Musculoskeletal: Negative for back pain and joint pain.  Skin: Negative for rash.  Neurological: Negative for sensory change, speech change, focal weakness and headaches.  Endo/Heme/Allergies: Does not bruise/bleed easily.  Psychiatric/Behavioral: Negative for depression. The patient is not nervous/anxious.     DRUG ALLERGIES:   Allergies  Allergen Reactions  . Prednisone     States had breathing difficulty on this with flushing and faint feeling.    VITALS:  Blood pressure 123/89, pulse 92, temperature 98 F (36.7 C), temperature source Oral, resp. rate (!) 22, height 5\' 3"  (1.6 m), weight 130.1 kg, SpO2 98 %.  PHYSICAL EXAMINATION:   Physical Exam  GENERAL:  58 y.o.-year-old patient lying in the bed with no acute distress.  Obese EYES: Pupils equal, round, reactive to light . No scleral icterus. Extraocular muscles intact.  HEENT: Head atraumatic, normocephalic. Oropharynx and nasopharynx clear.  NECK:  Supple, no jugular venous  distention. No thyroid enlargement, no tenderness.  LUNGS: Bibasilar crackles CARDIOVASCULAR: S1, S2 normal. No murmurs, rubs, or gallops.  ABDOMEN: Soft, nontender, nondistended. Bowel sounds present. No organomegaly or mass.  EXTREMITIES: No cyanosis, clubbing.  Lower extremity edema, patient has more discoloration of skin on the lower part of the legs bilaterally. NEUROLOGIC: Cranial nerves II through XII are intact. No focal Motor or sensory deficits b/l.   PSYCHIATRIC: The patient is alert and oriented x 3.  SKIN: No obvious rash, lesion, or ulcer.   LABORATORY PANEL:   CBC Recent Labs  Lab 08/04/19 0303  WBC 8.2  HGB 13.7  HCT 43.8  PLT 203   ------------------------------------------------------------------------------------------------------------------ Chemistries  Recent Labs  Lab 08/03/19 2302  08/07/19 0624  NA 139   < > 135  K 3.7   < > 3.5  CL 104   < > 94*  CO2 27   < > 28  GLUCOSE 132*   < > 110*  BUN 17   < > 39*  CREATININE 0.79   < > 0.99  CALCIUM 9.2   < > 9.9  MG  --    < > 1.9  AST 116*  --   --   ALT 155*  --   --   ALKPHOS 126  --   --   BILITOT 0.3  --   --    < > = values in this interval not displayed.   ------------------------------------------------------------------------------------------------------------------  Cardiac Enzymes No results for input(s): TROPONINI in the last 168 hours. ------------------------------------------------------------------------------------------------------------------  RADIOLOGY:  No results found.   ASSESSMENT AND PLAN:   1.  Acute on chronic diastolic CHF  with chronic respiratory failure, clinically not improved yet, continue IV Lasix at the present dose. - IV Lasix, Beta blockers - Input and Output - Counseled to limit fluids and Salt -Renal function is stable,-echocardiogram showed EF more than 60 to 65%.  No evidence of regional wall motion abnormality.   Cardiology follow up after  discharge   Will need further inpatient diuresis at least for another day or 2 in the hospital.  On IV diuretics, daughters want to make sure she is better before discharge. Patient's weight decreased from 140 kg to 130 kg and lost about 2.  Hypertension.  Toprol-XL will be continued.  3.  Left lower extremity radiculopathy with sciatica.    Neurontin 100 mg 3 times daily as scheduled instead of as needed, discussed with patient, patient's daughter.  4.  Obesity hypoventilation syndrome/obstructive sleep apnea.  We will continue CPAP.,  I reordered the CPAP.  5.  History of tobacco abuse.  She quit smoking in January of 2019.  6.  DVT prophylaxis.  Subcutaneous Lovenox. #7 leg cramps likely due to diuretics, hypomagnesemia, replace magnesium, continue Neurontin.,  Use Vicodin as needed. 8.  Skin discoloration in the legs likely due to CHF, patient can see dermatology as an outpatient if continues to bother her.  #9 .constipation,; use Colace, Dulcolax. All the records are reviewed and case discussed with Care Management/Social Worker Management plans discussed with the patient, family and they are in agreement.  CODE STATUS: FULL CODE  TOTAL TIME TAKING CARE OF THIS PATIENT: 35 minutes.  More than 50% time spent in counseling, coordination of care. POSSIBLE D/C IN 1-2 DAYS, DEPENDING ON CLINICAL CONDITION.  Epifanio Lesches M.D on 08/07/2019 at 1:43 PM  Between 7am to 6pm - Pager - [phone]  After 6pm go to [email or web address] - password [email or web address] [facility]  Office  [phone]  CC: Primary care physician; Center, [place]  Note: This dictation was prepared with Diplomatic Services operational officer dictation along with smaller phrase technology. Any transcriptional errors that result from this process are unintentional.

## 2019-08-07 NOTE — Plan of Care (Signed)
  Problem: Education: Goal: Ability to verbalize understanding of medication therapies will improve Outcome: Progressing   Problem: Education: Goal: Ability to demonstrate management of disease process will improve Outcome: Not Progressing  Patient does not abide by fluid restrictions despite continued education and encouragement.

## 2019-08-08 LAB — BASIC METABOLIC PANEL
Anion gap: 13 (ref 5–15)
BUN: 53 mg/dL — ABNORMAL HIGH (ref 6–20)
CO2: 26 mmol/L (ref 22–32)
Calcium: 9.7 mg/dL (ref 8.9–10.3)
Chloride: 92 mmol/L — ABNORMAL LOW (ref 98–111)
Creatinine, Ser: 1.21 mg/dL — ABNORMAL HIGH (ref 0.44–1.00)
GFR calc Af Amer: 58 mL/min — ABNORMAL LOW (ref >60)
GFR calc non Af Amer: 50 mL/min — ABNORMAL LOW (ref >60)
Glucose, Bld: 151 mg/dL — ABNORMAL HIGH (ref 70–99)
Potassium: 3.8 mmol/L (ref 3.5–5.1)
Sodium: 131 mmol/L — ABNORMAL LOW (ref 135–145)

## 2019-08-08 MED ORDER — MAGNESIUM HYDROXIDE 400 MG/5ML PO SUSP
30.0000 mL | Freq: Every day | ORAL | Status: DC | PRN
  Administered 2019-08-08: 30 mL via ORAL
  Filled 2019-08-08: qty 30

## 2019-08-08 MED ORDER — METOLAZONE 5 MG PO TABS
5.0000 mg | ORAL_TABLET | Freq: Every day | ORAL | Status: DC
  Filled 2019-08-08: qty 1

## 2019-08-08 NOTE — Progress Notes (Signed)
Cardiovascular and Pulmonary Nurse Navigator Note:    Rounded on patient to follow-up on CHF education provided on 08/06/2019 and to check on her progress.  Patient immediately started tearing up and becoming emotional when this nurse asked, "How are things going?"  Patient stated she knows the fluid is coming off slowly, but her main worry is lack of B.M. Patient reports she usually has a BM at least twice a day and she has not had one since Tuesday.  Patient c/o abdominal distention / tightness and back pain.  Patient did state that she knows the hydrocodone that she has taken for pain can also make you constipated.  This RN explained to patient diuretics can also cause constipation.  This RN spoke to patient's assigned nurse regarding the above.  The assigned RN to discuss with patient.    Will check on patient again at a later time.    Roanna Epley, RN, BSN, [facility]  Cardiovascular & Pulmonary Nurse Navigator  Direct Line: [phone]  Department Phone #: [phone] Fax: [phone]  Email Address: [email or web address]

## 2019-08-09 LAB — BASIC METABOLIC PANEL
Anion gap: 11 (ref 5–15)
BUN: 58 mg/dL — ABNORMAL HIGH (ref 6–20)
CO2: 30 mmol/L (ref 22–32)
Calcium: 9.8 mg/dL (ref 8.9–10.3)
Chloride: 90 mmol/L — ABNORMAL LOW (ref 98–111)
Creatinine, Ser: 1.38 mg/dL — ABNORMAL HIGH (ref 0.44–1.00)
GFR calc Af Amer: 49 mL/min — ABNORMAL LOW (ref >60)
GFR calc non Af Amer: 43 mL/min — ABNORMAL LOW (ref >60)
Glucose, Bld: 113 mg/dL — ABNORMAL HIGH (ref 70–99)
Potassium: 3.8 mmol/L (ref 3.5–5.1)
Sodium: 131 mmol/L — ABNORMAL LOW (ref 135–145)

## 2019-08-09 MED ORDER — FUROSEMIDE 10 MG/ML IJ SOLN
40.0000 mg | Freq: Two times a day (BID) | INTRAMUSCULAR | Status: DC
  Administered 2019-08-09: 40 mg via INTRAVENOUS
  Filled 2019-08-09: qty 4

## 2019-08-09 NOTE — Consult Note (Signed)
[facility]  CARDIOLOGY CONSULT NOTE  Patient ID: Christine Oneill MRN: [id number] DOB/AGE: October 17, 1961 58 y.o.  Admit date: 08/03/2019 Referring Physician Dr. Epifanio Lesches  Primary Physician [facility] Primary Cardiologist Dr. Serafina Royals  Reason for Consultation Acute on chronic diastolic congestive heart failure   HPI: Christine Oneill is a 58 year old female with a past medical history significant for chronic congestive heart failure with HFpEF on 2L supplemental O2, presumed obstructive sleep apnea, on CPAP, and hypertension who presented to the ED on 08/04/19 for a few day history of worsening dyspnea, cough, and wheezing as well as increased orthopnea, PND, and a weight gain of 10 pounds.  Workup in the ED was significant for chest xray revealing vascular congestion, elevated LFTs, BNP was normal, and high sensitivity troponin was negative x2.   Today, Christine Oneill reports some improvement in shortness of breath, but she is not back to her baseline.  Lower extremity swelling has improved. She also admits to a few days of constipation, but she had a normal bowel movement last night.  She denies chest pain or chest pressure.  She denies heart racing or palpitations.  Admits to occasional dizziness and lightheadedness with ambulation, but denies syncopal or presyncopal events.   Review of systems complete and found to be negative unless listed above     Past Medical History:  Diagnosis Date  . Abnormal uterine bleeding (AUB)   . Asthma   . CHF (congestive heart failure) ([place])   . COPD (chronic obstructive pulmonary disease) ([place])   . Hypertension   . Obstructive sleep apnea    with CPAP  . Pulmonary HTN ([place])     Past Surgical History:  Procedure Laterality Date  . APPENDECTOMY    . CESAREAN SECTION    . COLPOSCOPY N/A 04/30/2018   Procedure: COLPOSCOPY;  Surgeon: Rubie Maid, MD;  Location: [facility] ORS;  Service: Gynecology;  Laterality: N/A;  . HYSTEROSCOPY  W/D&C N/A 04/30/2018   Procedure: DILATATION AND CURETTAGE /HYSTEROSCOPY;  Surgeon: Rubie Maid, MD;  Location: [facility] ORS;  Service: Gynecology;  Laterality: N/A;    Medications Prior to Admission  Medication Sig Dispense Refill Last Dose  . albuterol (PROVENTIL HFA;VENTOLIN HFA) 108 (90 Base) MCG/ACT inhaler Inhale 2 puffs into the lungs every 6 (six) hours as needed for wheezing or shortness of breath. 1 Inhaler 2 prn at prn  . albuterol (PROVENTIL) (2.5 MG/3ML) 0.083% nebulizer solution Take 3 mLs (2.5 mg total) by nebulization every 6 (six) hours as needed for wheezing or shortness of breath. 75 mL 12 prn at prn  . furosemide (LASIX) 40 MG tablet Take 1 tablet (40 mg total) by mouth daily. 30 tablet 1 08/03/2019 at Unknown time  . ibuprofen (ADVIL) 800 MG tablet TAKE 1 TABLET BY MOUTH EVERY 8 HOURS AS NEEDED FOR MODERATE PAIN. (Patient taking differently: Take 800 mg by mouth daily. ) 60 tablet 0 08/03/2019  . metolazone (ZAROXOLYN) 2.5 MG tablet Take once per week, every Monday. 8 tablet 0 Past Week at Unknown time  . metoprolol succinate (TOPROL-XL) 25 MG 24 hr tablet Take 0.5 tablets (12.5 mg total) by mouth daily. Hold if SBP<105 30 tablet 0 08/03/2019 at Unknown time  . HYDROcodone-acetaminophen (NORCO/VICODIN) 5-325 MG tablet Take 1 tablet by mouth every 6 (six) hours as needed for severe pain. (Patient not taking: Reported on 08/04/2019) 10 tablet 0 Completed Course at Unknown time  . predniSONE (STERAPRED UNI-PAK 21 TAB) 10 MG (21) TBPK tablet  Take 6 tabs the the 1st day. Take 6 tabs the the 2nd day. Take 5 tabs the the 3rd day. Take 5 tabs the 4th day. Take 4 tabs the the 5th day.Take 4 tabs the the 6th day.Take 3 tabs the 7th day.Take 3 tabs the 8th day. Take 2 tabs the 9th day. Take 2 tabs the 10th day. Take 1 tab the 11th day. Take 1 tab the 12th day. (Patient not taking: Reported on 08/04/2019) 42 tablet 0 Completed Course at Unknown time   Social History   Socioeconomic History  .  Marital status: Single    Spouse name: Not on file  . Number of children: 2  . Years of education: 38  . Highest education level: 12th grade  Occupational History  . Not on file  Social Needs  . Financial resource strain: Somewhat hard  . Food insecurity    Worry: Never true    Inability: Never true  . Transportation needs    Medical: Yes    Non-medical: Yes  Tobacco Use  . Smoking status: Former Smoker    Packs/day: 1.00    Types: Cigarettes    Quit date: 12/25/2017    Years since quitting: 1.6  . Smokeless tobacco: Never Used  Substance and Sexual Activity  . Alcohol use: No  . Drug use: No  . Sexual activity: Never    Birth control/protection: None  Lifestyle  . Physical activity    Days per week: 7 days    Minutes per session: 30 min  . Stress: Not at all  Relationships  . Social connections    Talks on phone: More than three times a week    Gets together: More than three times a week    Attends religious service: More than 4 times per year    Active member of club or organization: No    Attends meetings of clubs or organizations: Never    Relationship status: Divorced  . Intimate partner violence    Fear of current or ex partner: Patient refused    Emotionally abused: Patient refused    Physically abused: Patient refused    Forced sexual activity: Patient refused  Other Topics Concern  . Not on file  Social History Narrative  . Not on file    Family History  Problem Relation Age of Onset  . Lung cancer Mother   . Kidney failure Father   . Hypertension Sister       Review of systems complete and found to be negative unless listed above      PHYSICAL EXAM  General: Well developed, morbidly obese, sitting up, in no acute distress HEENT:  Normocephalic and atramatic Neck:  No JVD.  Lungs: Bilateral crackles in upper and lower lobes noted bilaterally  Heart: HRRR . Normal S1 and S2 without gallops or murmurs.  Abdomen: Bowel sounds are positive,  abdomen soft and non-tender  Msk:  Back normal. Normal strength and tone for age. Extremities: No clubbing or cyanosis.  Trace peripheral edema in bilateral lower extremities  Neuro: Alert and oriented X 3. Psych:  Good affect, responds appropriately  Labs:   Lab Results  Component Value Date   WBC 8.2 08/04/2019   HGB 13.7 08/04/2019   HCT 43.8 08/04/2019   MCV 89.9 08/04/2019   PLT 203 08/04/2019    Recent Labs  Lab 08/03/19 2302  08/09/19 0519  NA 139   < > 131*  K 3.7   < > 3.8  CL 104   < > 90*  CO2 27   < > 30  BUN 17   < > 58*  CREATININE 0.79   < > 1.38*  CALCIUM 9.2   < > 9.8  PROT 7.4  --   --   BILITOT 0.3  --   --   ALKPHOS 126  --   --   ALT 155*  --   --   AST 116*  --   --   GLUCOSE 132*   < > 113*   < > = values in this interval not displayed.   Lab Results  Component Value Date   TROPONINI <0.03 04/08/2018   No results found for: CHOL No results found for: HDL No results found for: LDLCALC No results found for: TRIG No results found for: CHOLHDL No results found for: LDLDIRECT    Radiology: Dg Chest Portable 1 View  Result Date: 08/03/2019 CLINICAL DATA:  Shortness of breath. History of CHF. EXAM: PORTABLE CHEST 1 VIEW COMPARISON:  Most recent radiograph 06/18/2019 FINDINGS: Mild cardiomegaly, unchanged. Central vascular congestion. Mild interstitial thickening suspicious for pulmonary edema. Mild scarring at the left lung base. No confluent airspace disease, large pleural effusion or pneumothorax. No acute osseous abnormalities. IMPRESSION: Cardiomegaly with vascular congestion and mild interstitial thickening, suspicious for pulmonary edema. Electronically Signed   By: Keith Rake M.D.   On: 08/03/2019 23:40    EKG: Normal sinus rhythm with low voltage; no evidence of ischemic changes Echo: Normal LV systolic function with an EF estimated between 60-65% with no significant valvular abnormalities   ASSESSMENT AND PLAN:  1.  Acute on chronic  diastolic congestive heart failure   -Improving, down 10 pounds, still short of breath; last two doses of Lasix held due to hypotension   -Continue to diuresis with Lasix with close monitoring of BP  -Stressed the importance of a low sodium diet, fluid restriction, and daily weights; weight loss important   -With worsening exertional shortness of breath, may consider stress test in an outpatient setting with Dr. Clayborn Bigness  2.  Obstructive sleep apnea  -On CPAP but never underwent official sleep study; recommend sleep study in outpatient setting  3.  Hypertension   -Continue metoprolol; monitor BP with use of Lasix   The history, physical exam findings, and plan of care were all discussed with Dr. Bartholome Bill, and all decision making was made in collaboration.   Signed: Avie Arenas PA-C 08/09/2019, 7:38 AM

## 2019-08-09 NOTE — Progress Notes (Signed)
Patient care taken over by this RN. Patient resting comfortably in chair at this time. Call bell in reach.

## 2019-08-09 NOTE — Progress Notes (Deleted)
[place] at [place] NAME: Christine Oneill    MR#:  [id number]  DATE OF BIRTH:  1961-05-23  SUBJECTIVE: Patient blood pressure was soft, Lasix was held last night and this morning.  Creatinine is slightly elevated, constipation improved.  Seen by Dr. Clayborn Bigness last night, patient was told to have sleep studies as an outpatient.  Patient says that shortness of breath slightly better than yet before.  Told me that she takes Zaroxolyn once a week at home..  CHIEF COMPLAINT:   Chief Complaint  Patient presents with  . Shortness of Breath     REVIEW OF SYSTEMS:    Review of Systems  Constitutional: Negative for chills, fever and malaise/fatigue.  HENT: Negative for sore throat.   Eyes: Negative for blurred vision, double vision and pain.  Respiratory: Positive for shortness of breath. Negative for cough, hemoptysis and wheezing.   Cardiovascular: Positive for orthopnea and leg swelling. Negative for chest pain and palpitations.  Gastrointestinal: Positive for constipation. Negative for abdominal pain, diarrhea, heartburn, nausea and vomiting.  Genitourinary: Negative for dysuria and hematuria.  Musculoskeletal: Negative for back pain and joint pain.  Skin: Negative for rash.  Neurological: Negative for sensory change, speech change, focal weakness and headaches.  Endo/Heme/Allergies: Does not bruise/bleed easily.  Psychiatric/Behavioral: Negative for depression. The patient is not nervous/anxious.     DRUG ALLERGIES:   Allergies  Allergen Reactions  . Prednisone     States had breathing difficulty on this with flushing and faint feeling.    VITALS:  Blood pressure 118/78, pulse 78, temperature 97.8 F (36.6 C), temperature source Oral, resp. rate 18, height 5\' 3"  (1.6 m), weight (!) 138.6 kg, SpO2 98 %.  PHYSICAL EXAMINATION:   Physical Exam  GENERAL:  58 y.o.-year-old patient lying in the bed with no acute distress.  Obese EYES: Pupils  equal, round, reactive to light . No scleral icterus. Extraocular muscles intact.  HEENT: Head atraumatic, normocephalic. Oropharynx and nasopharynx clear.  NECK:  Supple, no jugular venous distention. No thyroid enlargement, no tenderness.  LUNGS: Bibasilar crackles CARDIOVASCULAR: S1, S2 normal. No murmurs, rubs, or gallops.  ABDOMEN: Soft, nontender, nondistended. Bowel sounds present. No organomegaly or mass.  EXTREMITIES: No cyanosis, clubbing.  Lower extremity edema, patient has more discoloration of skin on the lower part of the legs bilaterally. NEUROLOGIC: Cranial nerves II through XII are intact. No focal Motor or sensory deficits b/l.   PSYCHIATRIC: The patient is alert and oriented x 3.  SKIN: No obvious rash, lesion, or ulcer.   LABORATORY PANEL:   CBC Recent Labs  Lab 08/04/19 0303  WBC 8.2  HGB 13.7  HCT 43.8  PLT 203   ------------------------------------------------------------------------------------------------------------------ Chemistries  Recent Labs  Lab 08/03/19 2302  08/07/19 0624  08/09/19 0519  NA 139   < > 135   < > 131*  K 3.7   < > 3.5   < > 3.8  CL 104   < > 94*   < > 90*  CO2 27   < > 28   < > 30  GLUCOSE 132*   < > 110*   < > 113*  BUN 17   < > 39*   < > 58*  CREATININE 0.79   < > 0.99   < > 1.38*  CALCIUM 9.2   < > 9.9   < > 9.8  MG  --    < > 1.9  --   --  AST 116*  --   --   --   --   ALT 155*  --   --   --   --   ALKPHOS 126  --   --   --   --   BILITOT 0.3  --   --   --   --    < > = values in this interval not displayed.   ------------------------------------------------------------------------------------------------------------------  Cardiac Enzymes No results for input(s): TROPONINI in the last 168 hours. ------------------------------------------------------------------------------------------------------------------  RADIOLOGY:  No results found.   ASSESSMENT AND PLAN:   1.  Acute on chronic diastolic CHF with  chronic respiratory failure,   IV diuretics were held secondary to soft blood pressure, slightly elevated creatinine due to diuretics.  Appreciate cardiology following, hold Zaroxolyn.  Once her blood pressure improves start back on Lasix.  Continue IV diuretics over the weekend as the BP permits.   2.  Hypertension.  Toprol-XL will be continued.  3.  Left lower extremity radiculopathy with sciatica.    Neurontin 100 mg 3 times daily as scheduled instead of as needed, discussed with patient,  4.  Obesity hypoventilation syndrome/obstructive sleep apnea.  We will continue CPAP.,  I reordered the CPAP.  5.  History of tobacco abuse.  She quit smoking in January of 2019.  6.  DVT prophylaxis.  Subcutaneous Lovenox. #7. leg cramps likely due to diuretics, hypomagnesemia, replace magnesium, continue Neurontin.,  Use Vicodin as needed. 8.  Skin discoloration in the legs likely due to CHF, patient can see dermatology as an outpatient if continues to bother her.  #9 .constipation,; use Colace, Dulcolax. All the records are reviewed and case discussed with Care Management/Social Worker Management plans discussed with the patient, family and they are in agreement.  CODE STATUS: FULL CODE  TOTAL TIME TAKING CARE OF THIS PATIENT: 35 minutes.  More than 50% time spent in counseling, coordination of care. POSSIBLE D/C IN 1-2 DAYS, DEPENDING ON CLINICAL CONDITION.  Epifanio Lesches M.D on 08/09/2019 at 1:17 PM  Between 7am to 6pm - Pager - [phone]  After 6pm go to [email or web address] - password [email or web address] [facility]  Office  [phone]  CC: Primary care physician; Center, [place]  Note: This dictation was prepared with Diplomatic Services operational officer dictation along with smaller phrase technology. Any transcriptional errors that result from this process are unintentional.

## 2019-08-09 NOTE — Progress Notes (Signed)
Cardiovascular and Pulmonary Nurse Navigator Note:    Rounded on patient to follow-up on education provided on 08/06/2019.  Please see initial note from 08/06/2019.  Patient sitting up in recliner chair.  Patent remains on 2 liters oxygen via [place].   Patient feeling better today, as she had a BM last evening after she was given Miralax.  Patient reporting lasix doses had to be held last night and again this morning due to low BP.  BP 104/71 this a.m.  Patient informed this RN that cardiologist wants to keep her a couple more days to try to get more fluid off.  Patient also reported that she was seen by Dr. Clayborn Bigness last night and reported Dr. Clayborn Bigness discussed with  Patient the need to have an official sleep study to make sure her CPAP settings are correct.  This RN explained to patient untreated sleep apnea can cause cardiac issues and make one's heart failure worse.    Reviewed the "5 Steps to Living Better with Heart Failure" with patient:   1.  Daily weights every morning, about the same time, wearing the same amount of clothing,  and after urinating 2.  Assess your symptoms every morning - Know How You Feel.   *Reviewed the following information with patient: *Discussed when to call the Dr= weight gain of >2-3lb overnight of 5lb in a week, *Discussed yellow zone= call MD: weight gain of >2-3lb overnight of 5lb in a week, increased swelling, increased SOB when lying down, chest discomfort, dizziness, increased fatigue *Red Zone= call 911: struggle to breath, fainting or near fainting, significant chest pain ? 3.  Take all medications as prescribed by your doctor.   4.  Follow low sodium diet -  2000 mg sodium diet with 64 ounce fluid restriction unless otherwise restricted by your MD. 5.  Exercise / Activity - Remain as active as possible.   Patient thanked me for stopping in to see her.    Roanna Epley, RN, BSN, [facility]  Cardiovascular & Pulmonary Nurse  Navigator  Direct Line: [phone]  Department Phone #: [phone] Fax: [phone]  Email Address: [email or web address]

## 2019-08-09 NOTE — Progress Notes (Signed)
[place] at [place] NAME: Christine Oneill    MR#:  [id number]  DATE OF BIRTH:  1961/02/07  SUBJECTIVE: Patient blood pressure was soft, Lasix was held last night and this morning.  Creatinine is slightly elevated, constipation improved.  Seen by Dr. Clayborn Bigness last night, patient was told to have sleep studies as an outpatient.  Patient says that shortness of breath slightly better than yet before.  Told me that she takes Zaroxolyn once a week at home..  CHIEF COMPLAINT:   Chief Complaint  Patient presents with  . Shortness of Breath     REVIEW OF SYSTEMS:    Review of Systems  Constitutional: Negative for chills, fever and malaise/fatigue.  HENT: Negative for sore throat.   Eyes: Negative for blurred vision, double vision and pain.  Respiratory: Positive for shortness of breath. Negative for cough, hemoptysis and wheezing.   Cardiovascular: Positive for orthopnea and leg swelling. Negative for chest pain and palpitations.  Gastrointestinal: Positive for constipation. Negative for abdominal pain, diarrhea, heartburn, nausea and vomiting.  Genitourinary: Negative for dysuria and hematuria.  Musculoskeletal: Negative for back pain and joint pain.  Skin: Negative for rash.  Neurological: Negative for sensory change, speech change, focal weakness and headaches.  Endo/Heme/Allergies: Does not bruise/bleed easily.  Psychiatric/Behavioral: Negative for depression. The patient is not nervous/anxious.     DRUG ALLERGIES:   Allergies  Allergen Reactions  . Prednisone     States had breathing difficulty on this with flushing and faint feeling.    VITALS:  Blood pressure 118/78, pulse 78, temperature 97.8 F (36.6 C), temperature source Oral, resp. rate 18, height 5\' 3"  (1.6 m), weight (!) 138.6 kg, SpO2 98 %.  PHYSICAL EXAMINATION:   Physical Exam  GENERAL:  58 y.o.-year-old patient lying in the bed with no acute distress.  Obese EYES: Pupils  equal, round, reactive to light . No scleral icterus. Extraocular muscles intact.  HEENT: Head atraumatic, normocephalic. Oropharynx and nasopharynx clear.  NECK:  Supple, no jugular venous distention. No thyroid enlargement, no tenderness.  LUNGS: Bibasilar crackles CARDIOVASCULAR: S1, S2 normal. No murmurs, rubs, or gallops.  ABDOMEN: Soft, nontender, nondistended. Bowel sounds present. No organomegaly or mass.  EXTREMITIES: No cyanosis, clubbing.  Lower extremity edema, patient has more discoloration of skin on the lower part of the legs bilaterally. NEUROLOGIC: Cranial nerves II through XII are intact. No focal Motor or sensory deficits b/l.   PSYCHIATRIC: The patient is alert and oriented x 3.  SKIN: No obvious rash, lesion, or ulcer.   LABORATORY PANEL:   CBC Recent Labs  Lab 08/04/19 0303  WBC 8.2  HGB 13.7  HCT 43.8  PLT 203   ------------------------------------------------------------------------------------------------------------------ Chemistries  Recent Labs  Lab 08/03/19 2302  08/07/19 0624  08/09/19 0519  NA 139   < > 135   < > 131*  K 3.7   < > 3.5   < > 3.8  CL 104   < > 94*   < > 90*  CO2 27   < > 28   < > 30  GLUCOSE 132*   < > 110*   < > 113*  BUN 17   < > 39*   < > 58*  CREATININE 0.79   < > 0.99   < > 1.38*  CALCIUM 9.2   < > 9.9   < > 9.8  MG  --    < > 1.9  --   --  AST 116*  --   --   --   --   ALT 155*  --   --   --   --   ALKPHOS 126  --   --   --   --   BILITOT 0.3  --   --   --   --    < > = values in this interval not displayed.   ------------------------------------------------------------------------------------------------------------------  Cardiac Enzymes No results for input(s): TROPONINI in the last 168 hours. ------------------------------------------------------------------------------------------------------------------  RADIOLOGY:  No results found.   ASSESSMENT AND PLAN:   1.  Acute on chronic diastolic CHF with  chronic respiratory failure,   IV diuretics were held secondary to soft blood pressure, slightly elevated creatinine due to diuretics.  Appreciate cardiology following, hold Zaroxolyn.  Once her blood pressure improves start back on Lasix.  Continue IV diuretics over the weekend as the BP permits. Acute kidney injury due to diuretics, hold Zaroxolyn, decrease Lasix, monitor kidney function closely  2.  Hypertension.  Toprol-XL will be continued.  3.  Left lower extremity radiculopathy with sciatica.    Neurontin 100 mg 3 times daily as scheduled instead of as needed, discussed with patient,  4.  Obesity hypoventilation syndrome/obstructive sleep apnea.  We will continue CPAP.,  I reordered the CPAP.  5.  History of tobacco abuse.  She quit smoking in January of 2019.  6.  DVT prophylaxis.  Subcutaneous Lovenox. #7. leg cramps likely due to diuretics, hypomagnesemia, replace magnesium, continue Neurontin.,  Use Vicodin as needed. 8.  Skin discoloration in the legs likely due to CHF, patient can see dermatology as an outpatient if continues to bother her.  #9 .constipation,; use Colace, Dulcolax. All the records are reviewed and case discussed with Care Management/Social Worker Management plans discussed with the patient, family and they are in agreement.  CODE STATUS: FULL CODE  TOTAL TIME TAKING CARE OF THIS PATIENT: 35 minutes.  More than 50% time spent in counseling, coordination of care. POSSIBLE D/C IN 1-2 DAYS, DEPENDING ON CLINICAL CONDITION.  Epifanio Lesches M.D on 08/09/2019 at 1:30 PM  Between 7am to 6pm - Pager - [phone]  After 6pm go to [email or web address] - password [email or web address] [facility]  Office  [phone]  CC: Primary care physician; Center, [place]  Note: This dictation was prepared with Diplomatic Services operational officer dictation along with smaller phrase technology. Any transcriptional errors that result from this process are unintentional.

## 2019-08-10 LAB — BASIC METABOLIC PANEL
Anion gap: 13 (ref 5–15)
BUN: 55 mg/dL — ABNORMAL HIGH (ref 6–20)
CO2: 28 mmol/L (ref 22–32)
Calcium: 9.7 mg/dL (ref 8.9–10.3)
Chloride: 92 mmol/L — ABNORMAL LOW (ref 98–111)
Creatinine, Ser: 1.19 mg/dL — ABNORMAL HIGH (ref 0.44–1.00)
GFR calc Af Amer: 59 mL/min — ABNORMAL LOW (ref >60)
GFR calc non Af Amer: 51 mL/min — ABNORMAL LOW (ref >60)
Glucose, Bld: 113 mg/dL — ABNORMAL HIGH (ref 70–99)
Potassium: 3.2 mmol/L — ABNORMAL LOW (ref 3.5–5.1)
Sodium: 133 mmol/L — ABNORMAL LOW (ref 135–145)

## 2019-08-10 MED ORDER — FLEET ENEMA 7-19 GM/118ML RE ENEM
1.0000 | ENEMA | Freq: Every day | RECTAL | Status: DC | PRN
  Administered 2019-08-10: 1 via RECTAL
  Filled 2019-08-10: qty 1

## 2019-08-10 MED ORDER — POTASSIUM CHLORIDE CRYS ER 20 MEQ PO TBCR
20.0000 meq | EXTENDED_RELEASE_TABLET | Freq: Two times a day (BID) | ORAL | Status: DC
  Administered 2019-08-10 – 2019-08-11 (×3): 20 meq via ORAL
  Filled 2019-08-10 (×3): qty 1

## 2019-08-10 MED ORDER — POTASSIUM CHLORIDE CRYS ER 20 MEQ PO TBCR
20.0000 meq | EXTENDED_RELEASE_TABLET | Freq: Once | ORAL | Status: AC
  Administered 2019-08-10: 20 meq via ORAL
  Filled 2019-08-10: qty 1

## 2019-08-10 MED ORDER — DOCUSATE SODIUM 100 MG PO CAPS
100.0000 mg | ORAL_CAPSULE | Freq: Two times a day (BID) | ORAL | Status: DC
  Administered 2019-08-10 – 2019-08-11 (×3): 100 mg via ORAL
  Filled 2019-08-10 (×2): qty 1

## 2019-08-10 MED ORDER — FUROSEMIDE 40 MG PO TABS
40.0000 mg | ORAL_TABLET | Freq: Every day | ORAL | Status: DC
  Administered 2019-08-10 – 2019-08-11 (×2): 40 mg via ORAL
  Filled 2019-08-10 (×2): qty 1

## 2019-08-10 MED ORDER — POLYETHYLENE GLYCOL 3350 17 G PO PACK
17.0000 g | PACK | Freq: Every day | ORAL | Status: DC
  Administered 2019-08-10 – 2019-08-11 (×2): 17 g via ORAL
  Filled 2019-08-10 (×2): qty 1

## 2019-08-10 NOTE — Plan of Care (Signed)
  Problem: Activity: Goal: Risk for activity intolerance will decrease Outcome: Progressing   Problem: Education: Goal: Ability to demonstrate management of disease process will improve Outcome: Progressing Goal: Ability to verbalize understanding of medication therapies will improve Outcome: Progressing   Problem: Activity: Goal: Capacity to carry out activities will improve Outcome: Progressing

## 2019-08-10 NOTE — Progress Notes (Signed)
[place] at [place] NAME: Annettee Warkentin    MR#:  [id number]  DATE OF BIRTH:  08-27-1961  SUBJECTIVE:   CHIEF COMPLAINT:   Chief Complaint  Patient presents with  . Shortness of Breath  Patient seen and evaluated today Decreased shortness of breath Has constipation No complaints of chest pain No fever and chills  REVIEW OF SYSTEMS:    Review of Systems  Constitutional: Negative for chills, fever and malaise/fatigue.  HENT: Negative for sore throat.   Eyes: Negative for blurred vision, double vision and pain.  Respiratory: Positive for shortness of breath. Negative for cough, hemoptysis and wheezing.   Cardiovascular: Positive for orthopnea and leg swelling. Negative for chest pain and palpitations.  Gastrointestinal: Positive for constipation. Negative for abdominal pain, diarrhea, heartburn, nausea and vomiting.  Genitourinary: Negative for dysuria and hematuria.  Musculoskeletal: Negative for back pain and joint pain.  Skin: Negative for rash.  Neurological: Negative for sensory change, speech change, focal weakness and headaches.  Endo/Heme/Allergies: Does not bruise/bleed easily.  Psychiatric/Behavioral: Negative for depression. The patient is not nervous/anxious.     DRUG ALLERGIES:   Allergies  Allergen Reactions  . Prednisone     States had breathing difficulty on this with flushing and faint feeling.    VITALS:  Blood pressure 118/72, pulse 76, temperature 98.4 F (36.9 C), resp. rate 18, height 5\' 3"  (1.6 m), weight (!) 138.9 kg, SpO2 96 %.  PHYSICAL EXAMINATION:   Physical Exam  GENERAL:  58 y.o.-year-old patient lying in the bed with no acute distress.  Obese EYES: Pupils equal, round, reactive to light . No scleral icterus. Extraocular muscles intact.  HEENT: Head atraumatic, normocephalic. Oropharynx and nasopharynx clear.  NECK:  Supple, no jugular venous distention. No thyroid enlargement, no tenderness.   LUNGS: Bibasilar crackles, improved airflow in both lungs CARDIOVASCULAR: S1, S2 normal. No murmurs, rubs, or gallops.  ABDOMEN: Soft, nontender, nondistended. Bowel sounds present. No organomegaly or mass.  EXTREMITIES: No cyanosis, clubbing.  Lower extremity edema, patient has more discoloration of skin on the lower part of the legs bilaterally. NEUROLOGIC: Cranial nerves II through XII are intact. No focal Motor or sensory deficits b/l.   PSYCHIATRIC: The patient is alert and oriented x 3.  SKIN: No obvious rash, lesion, or ulcer.   LABORATORY PANEL:   CBC Recent Labs  Lab 08/04/19 0303  WBC 8.2  HGB 13.7  HCT 43.8  PLT 203   ------------------------------------------------------------------------------------------------------------------ Chemistries  Recent Labs  Lab 08/03/19 2302  08/07/19 0624  08/10/19 0544  NA 139   < > 135   < > 133*  K 3.7   < > 3.5   < > 3.2*  CL 104   < > 94*   < > 92*  CO2 27   < > 28   < > 28  GLUCOSE 132*   < > 110*   < > 113*  BUN 17   < > 39*   < > 55*  CREATININE 0.79   < > 0.99   < > 1.19*  CALCIUM 9.2   < > 9.9   < > 9.7  MG  --    < > 1.9  --   --   AST 116*  --   --   --   --   ALT 155*  --   --   --   --   ALKPHOS 126  --   --   --   --  BILITOT 0.3  --   --   --   --    < > = values in this interval not displayed.   ------------------------------------------------------------------------------------------------------------------  Cardiac Enzymes No results for input(s): TROPONINI in the last 168 hours. ------------------------------------------------------------------------------------------------------------------  RADIOLOGY:  No results found.   ASSESSMENT AND PLAN:   1.  Acute on chronic diastolic CHF with chronic respiratory failure,  Zaroxolyn on hold Start oral Lasix for diuresis Monitor electrolytes Appreciate cardiology follow-up Monitor renal function Acute kidney injury due to diuretics, hold Zaroxolyn,  decrease Lasix, monitor kidney function closely  2.  Hypertension.  Toprol-XL will be continued.  3.  Left lower extremity radiculopathy with sciatica.    Neurontin 100 mg 3 times daily as scheduled instead of as needed, discussed with patient,  4.  Obesity hypoventilation syndrome/obstructive sleep apnea.  We will continue CPAP.,  I reordered the CPAP. Sleep study as outpatient  5.  History of tobacco abuse.  She quit smoking in January of 2019.  6.  DVT prophylaxis.  Subcutaneous Lovenox.  7. leg cramps likely due to diuretics, hypomagnesemia, replace magnesium, continue Neurontin.,  Use Vicodin as needed.  8.  Skin discoloration in the legs likely due to CHF, patient can see dermatology as an outpatient .  9 .constipation : use Colace,, MiraLAX As needed Fleet enema  10.  Acute hypokalemia Replace potassium Monitor electrolytes  All the records are reviewed and case discussed with Care Management/Social Worker Management plans discussed with the patient, family and they are in agreement.  CODE STATUS: FULL CODE  TOTAL TIME TAKING CARE OF THIS PATIENT: 36 minutes.   More than 50% time spent in counseling, coordination of care. POSSIBLE D/C IN 1-2 DAYS, DEPENDING ON CLINICAL CONDITION.  Saundra Shelling M.D on 08/10/2019 at 11:53 AM  Between 7am to 6pm - Pager - [phone]  After 6pm go to [email or web address] - password [email or web address] [facility]  Office  [phone]  CC: Primary care physician; Center, [place]  Note: This dictation was prepared with Diplomatic Services operational officer dictation along with smaller phrase technology. Any transcriptional errors that result from this process are unintentional.

## 2019-08-10 NOTE — Progress Notes (Signed)
Patient Name: Christine Oneill Date of Encounter: 08/10/2019  Hospital Problem List     Active Problems:   Acute on chronic diastolic CHF (congestive heart failure) [facility]    Patient Profile     50 you female with history of HFpEF admitted with sob and pulmonary edema. Diuresed almost 14 liters. Complains of consitpation  Subjective   Less sob. Constipated  Inpatient Medications    . aspirin EC  81 mg Oral Daily  . docusate sodium  100 mg Oral BID  . enoxaparin (LOVENOX) injection  40 mg Subcutaneous Q12H  . furosemide  40 mg Oral Daily  . gabapentin  100 mg Oral TID  . metoprolol succinate  12.5 mg Oral Daily  . potassium chloride  20 mEq Oral BID  . Ensure Max Protein  11 oz Oral Daily  . sodium chloride flush  3 mL Intravenous Q12H    Vital Signs    Vitals:   08/09/19 2239 08/10/19 0332 08/10/19 0456 08/10/19 0832  BP:  114/79  118/72  Pulse: 79 77  76  Resp: 18 20  18   Temp:  98.4 F (36.9 C)  98.4 F (36.9 C)  TempSrc:  Oral    SpO2: 97% 98%  96%  Weight:   (!) 138.9 kg   Height:        Intake/Output Summary (Last 24 hours) at 08/10/2019 0913 Last data filed at 08/10/2019 0500 Gross per 24 hour  Intake 720 ml  Output 2600 ml  Net -1880 ml   Filed Weights   08/08/19 0442 08/09/19 0412 08/10/19 0456  Weight: 129.4 kg (!) 138.6 kg (!) 138.9 kg    Physical Exam    GEN: Well nourished, well developed, in no acute distress.  HEENT: normal.  Neck: Supple, no JVD, carotid bruits, or masses. Cardiac: RRR, no murmurs, rubs, or gallops. No clubbing, cyanosis, edema.  Radials/DP/PT 2+ and equal bilaterally.  Respiratory:  Respirations regular and unlabored, clear to auscultation bilaterally. GI: Soft, nontender, nondistended, BS + x 4. MS: no deformity or atrophy. Skin: warm and dry, no rash. Neuro:  Strength and sensation are intact. Psych: Normal affect.  Labs    CBC No results for input(s): WBC, NEUTROABS, HGB, HCT, MCV, PLT in the last 72  hours. Basic Metabolic Panel Recent Labs    08/09/19 0519 08/10/19 0544  NA 131* 133*  K 3.8 3.2*  CL 90* 92*  CO2 30 28  GLUCOSE 113* 113*  BUN 58* 55*  CREATININE 1.38* 1.19*  CALCIUM 9.8 9.7   Liver Function Tests No results for input(s): AST, ALT, ALKPHOS, BILITOT, PROT, ALBUMIN in the last 72 hours. No results for input(s): LIPASE, AMYLASE in the last 72 hours. Cardiac Enzymes No results for input(s): CKTOTAL, CKMB, CKMBINDEX, TROPONINI in the last 72 hours. BNP No results for input(s): BNP in the last 72 hours. D-Dimer No results for input(s): DDIMER in the last 72 hours. Hemoglobin A1C No results for input(s): HGBA1C in the last 72 hours. Fasting Lipid Panel No results for input(s): CHOL, HDL, LDLCALC, TRIG, CHOLHDL, LDLDIRECT in the last 72 hours. Thyroid Function Tests No results for input(s): TSH, T4TOTAL, T3FREE, THYROIDAB in the last 72 hours.  Invalid input(s): FREET3  Telemetry    nsr  ECG    nsr  Radiology    Dg Chest Portable 1 View  Result Date: 08/03/2019 CLINICAL DATA:  Shortness of breath. History of CHF. EXAM: PORTABLE CHEST 1 VIEW COMPARISON:  Most recent radiograph 06/18/2019 FINDINGS:  Mild cardiomegaly, unchanged. Central vascular congestion. Mild interstitial thickening suspicious for pulmonary edema. Mild scarring at the left lung base. No confluent airspace disease, large pleural effusion or pneumothorax. No acute osseous abnormalities. IMPRESSION: Cardiomegaly with vascular congestion and mild interstitial thickening, suspicious for pulmonary edema. Electronically Signed   By: Keith Rake M.D.   On: 08/03/2019 23:40    Assessment & Plan    HFpEF-EF 65%. Improved. WIll change to po lasix and supplement K.Shoudl be ready for discharge soon. FOllow up with Dr. Clayborn Bigness.   Signed, Javier Docker Waylyn Tenbrink MD 08/10/2019, 9:13 AM  Pager: [phone]

## 2019-08-11 ENCOUNTER — Inpatient Hospital Stay: Payer: BLUE CROSS/BLUE SHIELD

## 2019-08-11 LAB — BASIC METABOLIC PANEL
Anion gap: 13 (ref 5–15)
BUN: 51 mg/dL — ABNORMAL HIGH (ref 6–20)
CO2: 27 mmol/L (ref 22–32)
Calcium: 9.8 mg/dL (ref 8.9–10.3)
Chloride: 95 mmol/L — ABNORMAL LOW (ref 98–111)
Creatinine, Ser: 1.01 mg/dL — ABNORMAL HIGH (ref 0.44–1.00)
GFR calc Af Amer: >60 mL/min (ref >60)
GFR calc non Af Amer: >60 mL/min (ref >60)
Glucose, Bld: 107 mg/dL — ABNORMAL HIGH (ref 70–99)
Potassium: 3.7 mmol/L (ref 3.5–5.1)
Sodium: 135 mmol/L (ref 135–145)

## 2019-08-11 MED ORDER — GABAPENTIN 100 MG PO CAPS: 100.0000 mg | ORAL_CAPSULE | Freq: Three times a day (TID) | ORAL | 0 refills | Status: AC

## 2019-08-11 MED ORDER — POTASSIUM CHLORIDE CRYS ER 20 MEQ PO TBCR: 20.0000 meq | EXTENDED_RELEASE_TABLET | Freq: Every day | ORAL | 0 refills | Status: AC

## 2019-08-11 MED ORDER — ENSURE MAX PROTEIN PO LIQD: 11.0000 [oz_av] | Freq: Every day | ORAL | 0 refills | Status: AC

## 2019-08-11 MED ORDER — DOCUSATE SODIUM 100 MG PO CAPS: 100.0000 mg | ORAL_CAPSULE | Freq: Two times a day (BID) | ORAL | 0 refills | Status: AC

## 2019-08-11 MED ORDER — METOPROLOL SUCCINATE ER 25 MG PO TB24: 12.5000 mg | ORAL_TABLET | Freq: Every day | ORAL | 0 refills | Status: AC

## 2019-08-11 MED ORDER — ASPIRIN 81 MG PO TBEC: 81.0000 mg | DELAYED_RELEASE_TABLET | Freq: Every day | ORAL | 0 refills | Status: AC

## 2019-08-11 MED ORDER — HYDROCODONE-ACETAMINOPHEN 5-325 MG PO TABS: 1.0000 | ORAL_TABLET | Freq: Four times a day (QID) | ORAL | 0 refills | Status: AC | PRN

## 2019-08-11 MED ORDER — POLYETHYLENE GLYCOL 3350 17 G PO PACK: 17.0000 g | PACK | Freq: Every day | ORAL | 0 refills | Status: AC

## 2019-08-11 NOTE — Progress Notes (Signed)
Back from xray

## 2019-08-11 NOTE — Progress Notes (Signed)
To xray via w/c.

## 2019-08-11 NOTE — Discharge Summary (Signed)
[facility] [place] NAME: Christine Oneill    MR#:  [id number]  DATE OF BIRTH:  1961/09/14  DATE OF ADMISSION:  08/03/2019 ADMITTING PHYSICIAN: Christel Mormon, MD  DATE OF DISCHARGE: 08/11/2019  PRIMARY CARE PHYSICIAN: [facility] [place]   ADMISSION DIAGNOSIS:  Acute on chronic respiratory failure, unspecified whether with hypoxia or hypercapnia (HCC) [J96.20] Acute on chronic congestive heart failure, unspecified heart failure type ([place]) [I50.9]  DISCHARGE DIAGNOSIS:  Active Problems:   Acute on chronic diastolic CHF (congestive heart failure) (HCC) Acute hypokalemia Acute on chronic diastolic heart failure exacerbation Lower extremity radiculopathy sciatica Obesity hypoventilation Obstructive sleep apnea  SECONDARY DIAGNOSIS:   Past Medical History:  Diagnosis Date  . Abnormal uterine bleeding (AUB)   . Asthma   . CHF (congestive heart failure) ([place])   . COPD (chronic obstructive pulmonary disease) ([place])   . Hypertension   . Obstructive sleep apnea    with CPAP  . Pulmonary HTN ([place])      ADMITTING HISTORY Christine Oneill  is a 58 y.o. African-American female with a known history of multiple medical problems that include obstructive sleep apnea on home CPAP, hypertension, diastolic CHF, obesity and left sciatica, who presented to the emergency room with acute onset of abnormal weight gain of 10 pounds within the last 24-hour as well as dyspnea over the last couple of days with associated orthopnea and worsening lower extremity edema, paroxysmal nocturnal dyspnea and dyspnea on exertion.  She admits to dry cough with occasional wheezing.  She denies any fever or chills.  She denied any chest pain or palpitations.  She denied any nausea or vomiting or abdominal pain.  No dysuria, oliguria or hematuria or flank pain.Upon presentation to the emergency room, temperature was 100.3 with a blood pressure 138/89 pulse 89 respiratory to  20 and pulse currently 96% on room air.  Later on respiratory rate was up to 31 and heart rate 108.  Portable chest ray showed cardiomegaly with pulmonary vascular congestion or interstitial pulmonary edema consistent with acute CHF.  Labs were remarkable for elevated AST and ALT and troponin I was 8 and BNP 36.  COVID-19 test came back negative.  EKG showed normal sinus rhythm with rate of 89 with low voltage QRS. The patient was given 40 mg of IV Lasix in the ER.  She will be admitted to a telemetry bed for further evaluation and management.  HOSPITAL COURSE:  Patient was admitted to telemetry for acute on chronic diastolic heart failure exacerbation.  Patient was diuresed with IV Lasix and cardiology evaluated the patient during hospitalization.  Patient continued metolazone and Toprol during her stay.  Neurontin was added for lower extremity radiculopathy.  Patient uses oxygen as needed and CPAP at bedtime for sleep apnea.  She was worked up with echocardiogram which showed EF of 60 to 65%.  No regional wall motion abnormalities noted.  Patient was switched to p.o. Lasix for diuresis.  She will follow-up with cardiology in the clinic.  Patient also complained of constipation she was put on stool softeners.  Abdominal x-ray revealed normal colonic stool burden.  COVID-19 test is negative.  CONSULTS OBTAINED:  Treatment Team:  Teodoro Spray, MD  DRUG ALLERGIES:   Allergies  Allergen Reactions  . Prednisone     States had breathing difficulty on this with flushing and faint feeling.    DISCHARGE MEDICATIONS:   Allergies as of 08/11/2019  Reactions   Prednisone    States had breathing difficulty on this with flushing and faint feeling.      Medication List    STOP taking these medications   ibuprofen 800 MG tablet Commonly known as: ADVIL   predniSONE 10 MG (21) Tbpk tablet Commonly known as: STERAPRED UNI-PAK 21 TAB     TAKE these medications   albuterol (2.5 MG/3ML)  0.083% nebulizer solution Commonly known as: PROVENTIL Take 3 mLs (2.5 mg total) by nebulization every 6 (six) hours as needed for wheezing or shortness of breath.   albuterol 108 (90 Base) MCG/ACT inhaler Commonly known as: VENTOLIN HFA Inhale 2 puffs into the lungs every 6 (six) hours as needed for wheezing or shortness of breath.   aspirin 81 MG EC tablet Take 1 tablet (81 mg total) by mouth daily.   docusate sodium 100 MG capsule Commonly known as: COLACE Take 1 capsule (100 mg total) by mouth 2 (two) times daily.   Ensure Max Protein Liqd Take 330 mLs (11 oz total) by mouth daily for 15 days.   furosemide 40 MG tablet Commonly known as: LASIX Take 1 tablet (40 mg total) by mouth daily.   gabapentin 100 MG capsule Commonly known as: NEURONTIN Take 1 capsule (100 mg total) by mouth 3 (three) times daily.   HYDROcodone-acetaminophen 5-325 MG tablet Commonly known as: NORCO/VICODIN Take 1 tablet by mouth every 6 (six) hours as needed for up to 5 days for moderate pain. What changed: reasons to take this   metolazone 2.5 MG tablet Commonly known as: ZAROXOLYN Take once per week, every Monday.   metoprolol succinate 25 MG 24 hr tablet Commonly known as: TOPROL-XL Take 0.5 tablets (12.5 mg total) by mouth daily. Hold if SBP<105   polyethylene glycol 17 g packet Commonly known as: MIRALAX / GLYCOLAX Take 17 g by mouth daily.   potassium chloride SA 20 MEQ tablet Commonly known as: K-DUR Take 1 tablet (20 mEq total) by mouth daily.       Today  Patient seen today No shortness of breath Tolerating diet well Comfortable on oxygen by nasal cannula Hemodynamically stable VITAL SIGNS:  Blood pressure 103/69, pulse 79, temperature 98.4 F (36.9 C), temperature source Oral, resp. rate 18, height 5\' 3"  (1.6 m), weight (!) 138 kg, SpO2 98 %.  I/O:    Intake/Output Summary (Last 24 hours) at 08/11/2019 1325 Last data filed at 08/11/2019 0958 Gross per 24 hour  Intake  243 ml  Output 650 ml  Net -407 ml    PHYSICAL EXAMINATION:  Physical Exam  GENERAL:  58 y.o.-year-old patient lying in the bed with no acute distress.  LUNGS: Normal breath sounds bilaterally, no wheezing, rales,rhonchi or crepitation. No use of accessory muscles of respiration.  CARDIOVASCULAR: S1, S2 normal. No murmurs, rubs, or gallops.  ABDOMEN: Soft, non-tender, non-distended. Bowel sounds present. No organomegaly or mass.  NEUROLOGIC: Moves all 4 extremities. PSYCHIATRIC: The patient is alert and oriented x 3.  SKIN: No obvious rash, lesion, or ulcer.   DATA REVIEW:   CBC No results for input(s): WBC, HGB, HCT, PLT in the last 168 hours.  Chemistries  Recent Labs  Lab 08/07/19 0624  08/11/19 0456  NA 135   < > 135  K 3.5   < > 3.7  CL 94*   < > 95*  CO2 28   < > 27  GLUCOSE 110*   < > 107*  BUN 39*   < > 51*  CREATININE 0.99   < > 1.01*  CALCIUM 9.9   < > 9.8  MG 1.9  --   --    < > = values in this interval not displayed.    Cardiac Enzymes No results for input(s): TROPONINI in the last 168 hours.  Microbiology Results  Results for orders placed or performed during the hospital encounter of 08/03/19  SARS Coronavirus 2 [facility] order, Performed in [facility] hospital lab) Nasopharyngeal Nasopharyngeal Swab     Status: None   Collection Time: 08/03/19 11:29 PM   Specimen: Nasopharyngeal Swab  Result Value Ref Range Status   SARS Coronavirus 2 NEGATIVE NEGATIVE Final    Comment: (NOTE) If result is NEGATIVE SARS-CoV-2 target nucleic acids are NOT DETECTED. The SARS-CoV-2 RNA is generally detectable in upper and lower  respiratory specimens during the acute phase of infection. The lowest  concentration of SARS-CoV-2 viral copies this assay can detect is 250  copies / mL. A negative result does not preclude SARS-CoV-2 infection  and should not be used as the sole basis for treatment or other  patient management decisions.  A negative result may occur with   improper specimen collection / handling, submission of specimen other  than nasopharyngeal swab, presence of viral mutation(s) within the  areas targeted by this assay, and inadequate number of viral copies  (<250 copies / mL). A negative result must be combined with clinical  observations, patient history, and epidemiological information. If result is POSITIVE SARS-CoV-2 target nucleic acids are DETECTED. The SARS-CoV-2 RNA is generally detectable in upper and lower  respiratory specimens dur ing the acute phase of infection.  Positive  results are indicative of active infection with SARS-CoV-2.  Clinical  correlation with patient history and other diagnostic information is  necessary to determine patient infection status.  Positive results do  not rule out bacterial infection or co-infection with other viruses. If result is PRESUMPTIVE POSTIVE SARS-CoV-2 nucleic acids MAY BE PRESENT.   A presumptive positive result was obtained on the submitted specimen  and confirmed on repeat testing.  While 2019 novel coronavirus  (SARS-CoV-2) nucleic acids may be present in the submitted sample  additional confirmatory testing may be necessary for epidemiological  and / or clinical management purposes  to differentiate between  SARS-CoV-2 and other Sarbecovirus currently known to infect humans.  If clinically indicated additional testing with an alternate test  methodology [phone]) is advised. The SARS-CoV-2 RNA is generally  detectable in upper and lower respiratory sp ecimens during the acute  phase of infection. The expected result is Negative. Fact Sheet for Patients:  StrictlyIdeas.no Fact Sheet for Healthcare Providers: BankingDealers.co.za This test is not yet approved or cleared by the [place] FDA and has been authorized for detection and/or diagnosis of SARS-CoV-2 by FDA under an Emergency Use Authorization (EUA).  This EUA will  remain in effect (meaning this test can be used) for the duration of the COVID-19 declaration under Section 564(b)(1) of the Act, 21 U.S.C. section 360bbb-3(b)(1), unless the authorization is terminated or revoked sooner. Performed at [facility], [address] [place], [place] [address]     RADIOLOGY:  Bea Graff 1 View  Result Date: 08/11/2019 CLINICAL DATA:  CHF, swelling, abdominal pain EXAM: ABDOMEN - 1 VIEW COMPARISON:  None. FINDINGS: Nonobstructive bowel gas pattern. Normal colonic stool burden. Mild degenerative changes the lower lumbar spine. Visualized bony pelvis appears intact. IMPRESSION: Unremarkable abdominal radiograph. Normal colonic stool burden. Electronically Signed   By:  Julian Hy M.D.   On: 08/11/2019 13:00    Follow up with PCP in 1 week.  Management plans discussed with the patient, family and they are in agreement.  CODE STATUS: Full code    Code Status Orders  (From admission, onward)         Start     Ordered   08/04/19 0106  Full code  Continuous     08/04/19 0114        Code Status History    Date Active Date Inactive Code Status Order ID Comments User Context   12/25/2017 2054 12/29/2017 2151 Full Code [id number]  Fritzi Mandes, MD Inpatient   Advance Care Planning Activity      TOTAL TIME TAKING CARE OF THIS PATIENT ON DAY OF DISCHARGE: more than 35 minutes.   Saundra Shelling M.D on 08/11/2019 at 1:25 PM  Between 7am to 6pm - Pager - [phone]  After 6pm go to [email or web address] - password [email or web address] [facility]  Office  [phone]  CC: Primary care physician; [facility] [place]  Note: This dictation was prepared with Diplomatic Services operational officer dictation along with smaller phrase technology. Any transcriptional errors that result from this process are unintentional.

## 2019-08-12 ENCOUNTER — Telehealth: Payer: Self-pay | Admitting: Internal Medicine

## 2019-08-12 ENCOUNTER — Ambulatory Visit: Payer: BLUE CROSS/BLUE SHIELD | Admitting: Family

## 2019-08-12 NOTE — Telephone Encounter (Signed)

## 2019-08-12 NOTE — Progress Notes (Signed)
[facility]     Assessment and Plan:  Severe obstructive sleep apnea with obesity hypoventilation syndrome with morbid obesity. - Her BiPAP was prescribed when she was in the hospital, insurance does not not cover sleep study. -Continue using BiPAP, we will see if we can find out what the exact settings are.  Pulmonary edema. --Recent admission, continue Zaroxolyn 2.5 mg every Monday in addition to Lasix daily.  Chronic hypoxic respiratory failure. -Oxygen saturation is 90% at rest, she is advised that she should continue using oxygen at 2 L with ambulation particularly when leaving the home, and at night.  Is less important when she is sitting. - I have advised her to get a pulse oximeter so she can monitor her oxygen at home, she should use supplemental oxygen whenever her oxygen saturation drops below 90%.  Pulmonary hypertension. 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 -Continue management of underlying conditions.  Left abdominal bruising. - Given reassurance that these appear to be superficial, they are from her recent blood thinner shots while in the hospital and should resolve on their own.  Constipation. - I have asked her to go ahead and take over-the-counter laxative.  Return in about 6 months (around 02/13/2020).     Date: 08/12/2019  MRN# [id number] Christine Oneill 04-14-1961   Christine Oneill is a 58 y.o. old female seen in consultation for chief complaint of:    Chief Complaint  Patient presents with   Hospitalization Follow-up    pt states breathing is baseline since discharge. sob with exertion & prod cough with tan mucus. wearing cpap 8hr nightly- feels pressure & mask is okay    HPI: Christine Oneill is a 58 y.o. female with  diastolic congestive heart failure/cor pulmonale, obesity hypoventilation, chronic edema and volume overload. She was admitted to the hospital from 08/03/19 to 8/23 for acute pulmonary edema,  volume overload and treated with diuresis. She has previously been provided with bipap.   Her breathing was very rough when she first went into the hospital, it has been much better since getting the fluid off. She is doing pap every night and feels that it is helping, she still has trouble sleeping at night. She wakes every 2 hours spontaneously.  She remain on 2L oxygen with activity and with CPAP. She checks her O2 at home and finds that it is normal when she is sitting without oxygen.  She was discharged with her usual dose of lasix at 40 mg daily, and she has continued zaroxolyn 2.5 mg every Monday.  She complains of left-sided flank pain when she was given Lovenox injections in the hospital she continues to have bruising there.  **Echocardiogram 12/26/2017>>- The right ventricular systolic pressure was increased consistent with severe pulmonary hypertension.  EF 60%. chest x-ray 03/18/18, lungs are unremarkable, slightly less pulmonary edema than on previous chest x-ray on 12/25/17 which showed; findings of chronic bronchitis, pulmonary edema cardiomegaly.  Desat walk at rest on RA, sat was 94% and HR 77. Sat dropped to 86% and HR 99 after 180 feet, started on 2L oxygen and sat was 91% at rest. Ambulated addition 180 feet with mild dyspnea with oxygen 2L sat was 90% and HR 113.   Medication:    Current Outpatient Medications:    albuterol (PROVENTIL HFA;VENTOLIN HFA) 108 (90 Base) MCG/ACT inhaler, Inhale 2 puffs into the lungs every 6 (six) hours as needed for wheezing or shortness of breath., Disp: 1 Inhaler, Rfl: 2  albuterol (PROVENTIL) (2.5 MG/3ML) 0.083% nebulizer solution, Take 3 mLs (2.5 mg total) by nebulization every 6 (six) hours as needed for wheezing or shortness of breath., Disp: 75 mL, Rfl: 12   aspirin EC 81 MG EC tablet, Take 1 tablet (81 mg total) by mouth daily., Disp: 30 tablet, Rfl: 0   docusate sodium (COLACE) 100 MG capsule, Take 1 capsule (100 mg total) by mouth 2 (two)  times daily., Disp: 60 capsule, Rfl: 0   Ensure Max Protein (ENSURE MAX PROTEIN) LIQD, Take 330 mLs (11 oz total) by mouth daily for 15 days., Disp: 4950 mL, Rfl: 0   furosemide (LASIX) 40 MG tablet, Take 1 tablet (40 mg total) by mouth daily., Disp: 30 tablet, Rfl: 1   gabapentin (NEURONTIN) 100 MG capsule, Take 1 capsule (100 mg total) by mouth 3 (three) times daily., Disp: 90 capsule, Rfl: 0   HYDROcodone-acetaminophen (NORCO/VICODIN) 5-325 MG tablet, Take 1 tablet by mouth every 6 (six) hours as needed for up to 5 days for moderate pain., Disp: 10 tablet, Rfl: 0   metolazone (ZAROXOLYN) 2.5 MG tablet, Take once per week, every Monday., Disp: 8 tablet, Rfl: 0   metoprolol succinate (TOPROL-XL) 25 MG 24 hr tablet, Take 0.5 tablets (12.5 mg total) by mouth daily. Hold if SBP<105, Disp: 30 tablet, Rfl: 0   polyethylene glycol (MIRALAX / GLYCOLAX) 17 g packet, Take 17 g by mouth daily., Disp: 30 packet, Rfl: 0   potassium chloride SA (K-DUR) 20 MEQ tablet, Take 1 tablet (20 mEq total) by mouth daily., Disp: 30 tablet, Rfl: 0   Allergies:  Prednisone   Review of Systems:  Constitutional: Feels well. Cardiovascular: Denies chest pain, exertional chest pain.  Pulmonary: Denies hemoptysis, pleuritic chest pain.   The remainder of systems were reviewed and were found to be negative other than what is documented in the HPI.    Physical Examination:   VS: BP 132/78 (BP Location: Left Arm, Cuff Size: Normal)    Pulse 87    Temp 98 F (36.7 C) (Temporal)    Ht 5\' 3"  (1.6 m)    Wt (!) 305 lb (138.3 kg)    SpO2 97%    BMI 54.03 kg/m   General Appearance: No distress  Neuro:without focal findings, mental status, speech normal, alert and oriented HEENT: PERRLA, EOM intact Pulmonary: No wheezing, No rales  CardiovascularNormal S1,S2.  No m/r/g.  Abdomen: Benign, Soft, non-tender, No masses left flank bruising, appears superficial with no underlying mass.  Minimal tenderness at the  site. Renal:  No costovertebral tenderness  GU:  No performed at this time. Endoc: No evident thyromegaly, no signs of acromegaly or Cushing features Skin:   warm, no rashes, no ecchymosis  Extremities: normal, no cyanosis, clubbing.    LABORATORY PANEL:   CBC No results for input(s): WBC, HGB, HCT, PLT in the last 168 hours. ------------------------------------------------------------------------------------------------------------------  Chemistries  Recent Labs  Lab 08/07/19 0624  08/11/19 0456  NA 135   < > 135  K 3.5   < > 3.7  CL 94*   < > 95*  CO2 28   < > 27  GLUCOSE 110*   < > 107*  BUN 39*   < > 51*  CREATININE 0.99   < > 1.01*  CALCIUM 9.9   < > 9.8  MG 1.9  --   --    < > = values in this interval not displayed.   ------------------------------------------------------------------------------------------------------------------  Cardiac Enzymes No results for  input(s): TROPONINI in the last 168 hours. ------------------------------------------------------------  RADIOLOGY:  Dg Abd 1 View  Result Date: 08/11/2019 CLINICAL DATA:  CHF, swelling, abdominal pain EXAM: ABDOMEN - 1 VIEW COMPARISON:  None. FINDINGS: Nonobstructive bowel gas pattern. Normal colonic stool burden. Mild degenerative changes the lower lumbar spine. Visualized bony pelvis appears intact. IMPRESSION: Unremarkable abdominal radiograph. Normal colonic stool burden. Electronically Signed   By: Julian Hy M.D.   On: 08/11/2019 13:00       Thank  you for the consultation and for allowing [facility] to assist in the care of your patient. Our recommendations are noted above.  Please contact us if we can be of further service.  Marda Stalker, M.D., F.C.C.P.  Board Certified in Internal Medicine, Pulmonary Medicine, [place], and Sleep Medicine.  [facility] Office Number: [phone]   08/12/2019

## 2019-08-13 ENCOUNTER — Ambulatory Visit (INDEPENDENT_AMBULATORY_CARE_PROVIDER_SITE_OTHER): Payer: BLUE CROSS/BLUE SHIELD | Admitting: Internal Medicine

## 2019-08-13 ENCOUNTER — Other Ambulatory Visit: Payer: Self-pay

## 2019-08-13 ENCOUNTER — Encounter: Payer: Self-pay | Admitting: Internal Medicine

## 2019-08-13 VITALS — BP 132/78 | HR 87 | Temp 98.0°F | Ht 63.0 in | Wt 305.0 lb

## 2019-08-13 DIAGNOSIS — G4733 Obstructive sleep apnea (adult) (pediatric): Secondary | ICD-10-CM

## 2019-08-13 DIAGNOSIS — J9611 Chronic respiratory failure with hypoxia: Secondary | ICD-10-CM | POA: Diagnosis not present

## 2019-08-13 DIAGNOSIS — E662 Morbid (severe) obesity with alveolar hypoventilation: Secondary | ICD-10-CM | POA: Diagnosis not present

## 2019-08-13 DIAGNOSIS — Z23 Encounter for immunization: Secondary | ICD-10-CM | POA: Diagnosis not present

## 2019-08-13 NOTE — Patient Instructions (Addendum)
Continue to take your water pills as prescribed.  Continue pap every night.  Continue oxygen.   You may use an OTC laxative if you have done well with it in the past.  Flu shot today.

## 2019-08-14 NOTE — Progress Notes (Deleted)
Patient ID: Christine Oneill, female    DOB: 06/27/1961, 58 y.o.   MRN: [id number]  HPI  [name] is a 58 y/o female with a history of pulmonary HTN, obstructive sleep apnea, HTN, previous tobacco use and chronic heart failure.   Echo report from 08/05/19 reviewed and showed an EF of 60-65%    Was admitted 08/03/19 for shortness of breath, weight gain for 8 days. Was treated with IV lasix that was changed to PO with metolazone on Mondays. Went to ER 06/07/19, 06/18/19, 06/20/19 for left sciatica pain, was treated and released.   She presents today for a follow-up visit with a chief complaint of   Past Medical History:  Diagnosis Date  . Abnormal uterine bleeding (AUB)   . Asthma   . CHF (congestive heart failure) ([place])   . COPD (chronic obstructive pulmonary disease) ([place])   . Hypertension   . Obstructive sleep apnea    with CPAP  . Pulmonary HTN ([place])     Past Surgical History:  Procedure Laterality Date  . APPENDECTOMY    . CESAREAN SECTION    . COLPOSCOPY N/A 04/30/2018   Procedure: COLPOSCOPY;  Surgeon: Rubie Maid, MD;  Location: [facility];  Service: Gynecology;  Laterality: N/A;  . HYSTEROSCOPY W/D&C N/A 04/30/2018   Procedure: DILATATION AND CURETTAGE /HYSTEROSCOPY;  Surgeon: Rubie Maid, MD;  Location: [facility];  Service: Gynecology;  Laterality: N/A;   Family History  Problem Relation Age of Onset  . Lung cancer Mother   . Kidney failure Father   . Hypertension Sister    Social History   Tobacco Use  . Smoking status: Former Smoker    Packs/day: 1.00    Types: Cigarettes    Quit date: 12/25/2017    Years since quitting: 1.6  . Smokeless tobacco: Never Used  Substance Use Topics  . Alcohol use: No   Allergies  Allergen Reactions  . Prednisone     States had breathing difficulty on this with flushing and faint feeling.      Review of Systems  Constitutional: Positive for fatigue (minimal). Negative for appetite change.  HENT: Negative for congestion, postnasal  drip and sore throat.   Eyes: Negative.   Respiratory: Positive for shortness of breath (going up steps; walking long distances). Negative for cough and chest tightness.   Cardiovascular: Positive for leg swelling (left lower leg at times). Negative for chest pain and palpitations.  Gastrointestinal: Negative for abdominal distention and abdominal pain.  Endocrine: Negative.   Genitourinary: Negative.   Musculoskeletal: Positive for arthralgias (left upper leg burning). Negative for back pain and neck pain.  Skin: Negative.   Allergic/Immunologic: Negative.   Neurological: Negative for dizziness and light-headedness.  Hematological: Negative for adenopathy. Does not bruise/bleed easily.  Psychiatric/Behavioral: Negative for dysphoric mood and sleep disturbance (wearing oxygen on CPAP). The patient is not nervous/anxious.     Physical Exam  Constitutional: She is oriented to person, place, and time. She appears well-developed and well-nourished.  HENT:  Head: Normocephalic and atraumatic.  Neck: Normal range of motion. Neck supple. No JVD present.  Cardiovascular: Normal rate and regular rhythm.  Pulmonary/Chest: Effort normal. She has no wheezes. She has no rales.  Abdominal: Soft. She exhibits no distension. There is no abdominal tenderness.  Musculoskeletal:        General: Edema (trace edema in left lower leg) present. No tenderness.  Neurological: She is alert and oriented to person, place, and time.  Skin: Skin is warm and  dry.  Psychiatric: She has a normal mood and affect. Her behavior is normal. Thought content normal.  Nursing note and vitals reviewed.  Assessment & Plan:  1: Chronic heart failure with preserved ejection fraction- - NYHA class II - euvolemic today - weighing daily. Reminded to call for an overnight weight gain of >2 pounds or a weekly weight gain of >5 pounds - weight up 11 pounds from last visit 7 months ago - not adding salt and has been reading food  labels for sodium content. Reminded to keep daily sodium intake to 2000mg . - has been walking more but admits to not walking much due to Christmas holiday - has been trying to keep daily fluid intake to closer to 60 ounces - BNP on 08/03/19 was 36.0 - saw cardiology Nehemiah Massed) 04/03/18  2: HTN- - BP looks good today - saw PCP at [facility]) ~4-5 months ago and returns January 2020 - BMP on 08/11/19 reviewed and showed sodium 135, potassium 3.7, creatinine 1.01 and GFR >60  3: Obstructive sleep apnea- - wearing CPAP nightly  - wearing oxygen at 2L upon exertion, bedtime and PRN at rest - saw pulmonologist Ashby Dawes) 08/13/19 - no longer smoking  4: Lymphedema- - stage 2 - trace edema in left lower leg; she says this occurs intermittently - could consider lymphapress compression boots if edema persists  Patient did not bring her medications nor a list. Each medication was verbally reviewed with the patient and she was encouraged to bring the bottles to every visit to confirm accuracy of list.  Patient to return in 6 months or sooner for any questions/problems before then.

## 2019-08-15 ENCOUNTER — Other Ambulatory Visit
Admission: RE | Admit: 2019-08-15 | Discharge: 2019-08-15 | Disposition: A | Discharge status: Home / Self Care | Payer: BLUE CROSS/BLUE SHIELD | Source: Ambulatory Visit | Attending: Internal Medicine | Admitting: Internal Medicine

## 2019-08-15 DIAGNOSIS — I509 Heart failure, unspecified: Secondary | ICD-10-CM | POA: Insufficient documentation

## 2019-08-15 LAB — BRAIN NATRIURETIC PEPTIDE: B Natriuretic Peptide: 59 pg/mL (ref 0.0–100.0)

## 2019-08-19 ENCOUNTER — Ambulatory Visit: Payer: BLUE CROSS/BLUE SHIELD | Admitting: Family

## 2019-09-19 DEATH — deceased

## 2019-09-28 ENCOUNTER — Other Ambulatory Visit: Payer: Self-pay | Admitting: Internal Medicine

## 2019-12-03 ENCOUNTER — Ambulatory Visit: Payer: BLUE CROSS/BLUE SHIELD | Admitting: Family

## 2019-12-30 IMAGING — DX PORTABLE CHEST - 1 VIEW
1 series · 1 of 1 positions shown · non-contrast
Comparison: Most recent radiograph 06/18/2019

CLINICAL DATA: Shortness of breath. History of CHF.

EXAM:
PORTABLE CHEST 1 VIEW

[chest ap]
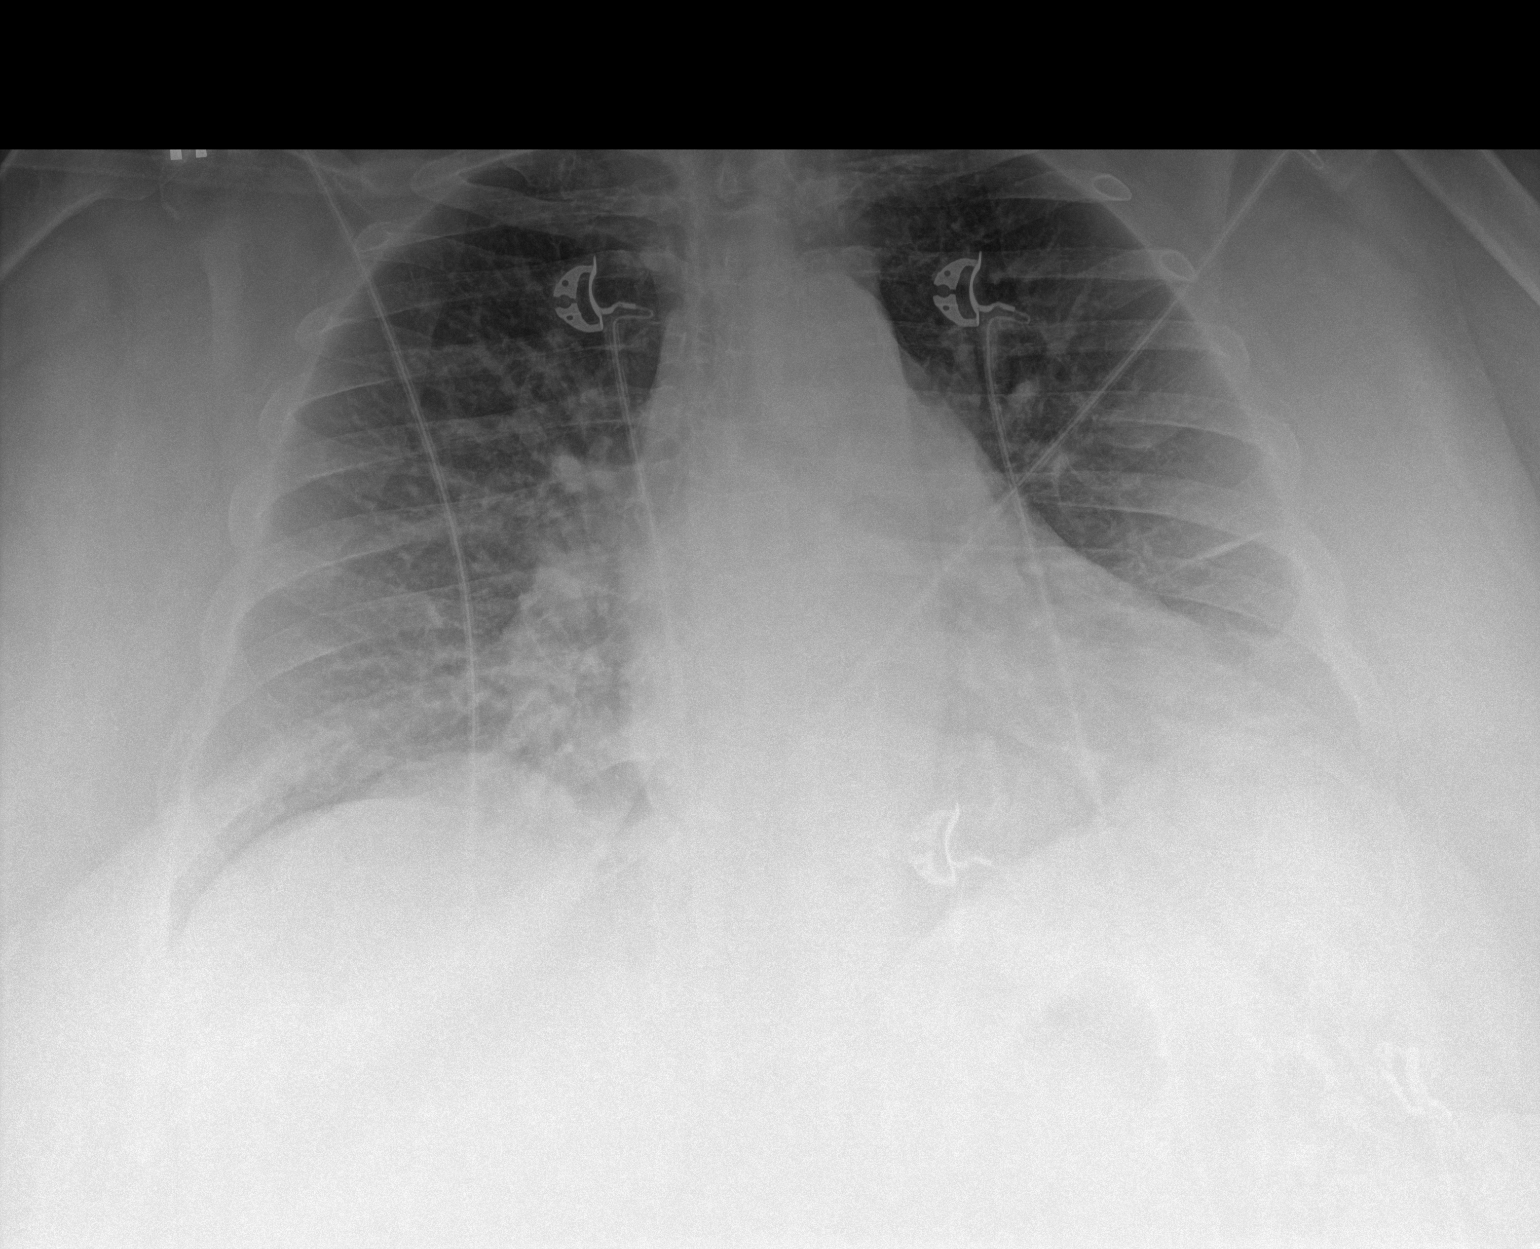

[1 of 1 positions shown; findings below may reference images not displayed]

FINDINGS: Mild cardiomegaly, unchanged. Central vascular congestion. Mild
interstitial thickening suspicious for pulmonary edema. Mild
scarring at the left lung base. No confluent airspace disease, large
pleural effusion or pneumothorax. No acute osseous abnormalities.
IMPRESSION: Cardiomegaly with vascular congestion and mild interstitial
thickening, suspicious for pulmonary edema.
